# Patient Record
Sex: Female | Born: 2000 | Race: White | Hispanic: No | Marital: Single | Smoking: Never smoker
Health system: Southern US, Community
[De-identification: ages and names within clinical notes are randomized; demographics above are authoritative.]

## PROBLEM LIST (undated history)

## (undated) DIAGNOSIS — N946 Dysmenorrhea, unspecified: Secondary | ICD-10-CM

## (undated) DIAGNOSIS — E282 Polycystic ovarian syndrome: Secondary | ICD-10-CM

## (undated) DIAGNOSIS — E079 Disorder of thyroid, unspecified: Secondary | ICD-10-CM

## (undated) HISTORY — DX: Dysmenorrhea, unspecified: N94.6

## (undated) HISTORY — DX: Disorder of thyroid, unspecified: E07.9

## (undated) HISTORY — DX: Polycystic ovarian syndrome: E28.2

---
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
## 2019-02-24 DIAGNOSIS — L700 Acne vulgaris: Secondary | ICD-10-CM | POA: Diagnosis not present

## 2019-03-05 DIAGNOSIS — Z1159 Encounter for screening for other viral diseases: Secondary | ICD-10-CM | POA: Diagnosis not present

## 2019-03-05 DIAGNOSIS — J029 Acute pharyngitis, unspecified: Secondary | ICD-10-CM | POA: Diagnosis not present

## 2019-03-05 DIAGNOSIS — J069 Acute upper respiratory infection, unspecified: Secondary | ICD-10-CM | POA: Diagnosis not present

## 2019-04-26 DIAGNOSIS — Z01812 Encounter for preprocedural laboratory examination: Secondary | ICD-10-CM | POA: Diagnosis not present

## 2019-04-26 DIAGNOSIS — Z20828 Contact with and (suspected) exposure to other viral communicable diseases: Secondary | ICD-10-CM | POA: Diagnosis not present

## 2019-05-10 DIAGNOSIS — Z20828 Contact with and (suspected) exposure to other viral communicable diseases: Secondary | ICD-10-CM | POA: Diagnosis not present

## 2019-05-13 ENCOUNTER — Encounter: Payer: BC Managed Care – PPO | Admitting: Obstetrics and Gynecology

## 2019-05-28 DIAGNOSIS — M5416 Radiculopathy, lumbar region: Secondary | ICD-10-CM | POA: Diagnosis not present

## 2019-05-28 DIAGNOSIS — M9903 Segmental and somatic dysfunction of lumbar region: Secondary | ICD-10-CM | POA: Diagnosis not present

## 2019-05-28 DIAGNOSIS — M62830 Muscle spasm of back: Secondary | ICD-10-CM | POA: Diagnosis not present

## 2019-05-28 DIAGNOSIS — M9901 Segmental and somatic dysfunction of cervical region: Secondary | ICD-10-CM | POA: Diagnosis not present

## 2019-06-05 DIAGNOSIS — E063 Autoimmune thyroiditis: Secondary | ICD-10-CM | POA: Diagnosis not present

## 2019-06-05 DIAGNOSIS — D164 Benign neoplasm of bones of skull and face: Secondary | ICD-10-CM | POA: Diagnosis not present

## 2019-07-03 ENCOUNTER — Telehealth: Payer: Self-pay

## 2019-07-03 NOTE — Telephone Encounter (Signed)
Ms. Devenport called stating that she is having irregular bleeding and would like a call from the doctor or nurse.   Thank you, Ardelle Lesches

## 2019-07-08 NOTE — Telephone Encounter (Signed)
Pt stated that she has noticed that the bleeding has stopped for the most part. Pt made an appointment to be seen to discuss the issues with [facility].

## 2019-07-15 ENCOUNTER — Encounter: Payer: BC Managed Care – PPO | Admitting: Obstetrics and Gynecology

## 2019-07-29 ENCOUNTER — Encounter: Payer: BC Managed Care – PPO | Admitting: Obstetrics and Gynecology

## 2019-07-30 DIAGNOSIS — M9901 Segmental and somatic dysfunction of cervical region: Secondary | ICD-10-CM | POA: Diagnosis not present

## 2019-07-30 DIAGNOSIS — M9903 Segmental and somatic dysfunction of lumbar region: Secondary | ICD-10-CM | POA: Diagnosis not present

## 2019-07-30 DIAGNOSIS — M62830 Muscle spasm of back: Secondary | ICD-10-CM | POA: Diagnosis not present

## 2019-07-30 DIAGNOSIS — M5416 Radiculopathy, lumbar region: Secondary | ICD-10-CM | POA: Diagnosis not present

## 2019-08-15 DIAGNOSIS — K011 Impacted teeth: Secondary | ICD-10-CM | POA: Diagnosis not present

## 2019-08-15 HISTORY — PX: WISDOM TOOTH EXTRACTION: SHX21

## 2019-08-21 NOTE — Progress Notes (Signed)
GYNECOLOGY ANNUAL PHYSICAL EXAM PROGRESS NOTE  Subjective:    Dominique Morris is a 19 y.o. G0P0000 female with a history of dysmenorrhea and thyroid disease who presents for an annual exam. The patient is sexually active (1 partner).  The patient wears seatbelts: yes. Has the patient ever been transfused or tattooed?: no. The patient reports that there is not domestic violence in her life.   The patient has the following complaints today.  1. Patient has a history of psoriasis (mostly in scalp and behind ears)  which is managed by her Dermatologist.  Rozell Searing that she may be getting a patch in the vulvar area. Has itching sometimes.    Gynecologic History No LMP recorded. Menarche age: 60 or 70.  Contraception: OCP (estrogen/progesterone) History of STI's: Denies Last Pap: Patient has never had one.    OB History  Gravida Para Term Preterm AB Living  0 0 0 0 0 0  SAB TAB Ectopic Multiple Live Births  0 0 0 0 0    Past Medical History:  Diagnosis Date  . Dysmenorrhea   . Thyroid disease     Past Surgical History:  Procedure Laterality Date  . OSTEOCHONDROMA EXCISION  02/2014    Family History  Problem Relation Age of Onset  . Endometriosis Mother   . Hyperlipidemia Father   . Breast cancer Neg Hx   . Ovarian cancer Neg Hx   . Colon cancer Neg Hx     Social History   Socioeconomic History  . Marital status: Single    Spouse name: Not on file  . Number of children: Not on file  . Years of education: Not on file  . Highest education level: Not on file  Occupational History  . Not on file  Tobacco Use  . Smoking status: Never Smoker  . Smokeless tobacco: Never Used  Substance and Sexual Activity  . Alcohol use: No  . Drug use: No  . Sexual activity: Yes    Birth control/protection: Pill, Condom  Other Topics Concern  . Not on file  Social History Narrative  . Not on file   Social Determinants of Health   Financial Resource Strain:   . Difficulty  of Paying Living Expenses:   Food Insecurity:   . Worried About Charity fundraiser in the Last Year:   . Arboriculturist in the Last Year:   Transportation Needs:   . Film/video editor (Medical):   Marland Kitchen Lack of Transportation (Non-Medical):   Physical Activity:   . Days of Exercise per Week:   . Minutes of Exercise per Session:   Stress:   . Feeling of Stress :   Social Connections:   . Frequency of Communication with Friends and Family:   . Frequency of Social Gatherings with Friends and Family:   . Attends Religious Services:   . Active Member of Clubs or Organizations:   . Attends Archivist Meetings:   Marland Kitchen Marital Status:   Intimate Partner Violence:   . Fear of Current or Ex-Partner:   . Emotionally Abused:   Marland Kitchen Physically Abused:   . Sexually Abused:     Current Outpatient Medications on File Prior to Visit  Medication Sig Dispense Refill  . Doxycycline Hyclate 150 MG TABS Take 150 mg by mouth daily. 10 tablet 1  . ibuprofen (ADVIL,MOTRIN) 800 MG tablet Take 1 tablet (800 mg total) by mouth every 8 (eight) hours as needed for cramping. Justice  tablet 3  . norgestimate-ethinyl estradiol (ORTHO-CYCLEN) 0.25-35 MG-MCG tablet Take 1 tablet by mouth daily. 1 Package 11   No current facility-administered medications on file prior to visit.    Allergies  Allergen Reactions  . Latex     Other reaction(s): Other (See Comments) Other Reaction: Sesitivity      Review of Systems Constitutional: negative for chills, fatigue, fevers and sweats Eyes: negative for irritation, redness and visual disturbance Ears, nose, mouth, throat, and face: negative for hearing loss, nasal congestion, snoring and tinnitus Respiratory: negative for asthma, cough, sputum Cardiovascular: negative for chest pain, dyspnea, exertional chest pressure/discomfort, irregular heart beat, palpitations and syncope Gastrointestinal: negative for abdominal pain, change in bowel habits, nausea and  vomiting Genitourinary: negative for abnormal menstrual periods, genital lesions, sexual problems and vaginal discharge, dysuria and urinary incontinence. Positive for intermittent vulvar itching.  Integument/breast: negative for breast lump, breast tenderness and nipple discharge Hematologic/lymphatic: negative for bleeding and easy bruising Musculoskeletal:negative for back pain and muscle weakness Neurological: negative for dizziness, headaches, vertigo and weakness Endocrine: negative for diabetic symptoms including polydipsia, polyuria and skin dryness Allergic/Immunologic: negative for hay fever and urticaria        Objective:  Blood pressure 125/77, pulse 79, height 5\' 9"  (1.753 m), weight 199 lb (90.3 kg), last menstrual period 08/11/2019. Body mass index is 29.39 kg/m.  General Appearance:    Alert, cooperative, no distress, appears stated age, overweight.   Head:    Normocephalic, without obvious abnormality, atraumatic  Eyes:    PERRL, conjunctiva/corneas clear, EOM's intact, both eyes  Ears:    Normal external ear canals, both ears  Nose:   Nares normal, septum midline, mucosa normal, no drainage or sinus tenderness  Throat:   Lips, mucosa, and tongue normal; teeth and gums normal  Neck:   Supple, symmetrical, trachea midline, no adenopathy; thyroid: no enlargement/tenderness/nodules; no carotid bruit or JVD  Back:     Symmetric, no curvature, ROM normal, no CVA tenderness  Lungs:     Clear to auscultation bilaterally, respirations unlabored  Chest Wall:    No tenderness or deformity   Heart:    Regular rate and rhythm, S1 and S2 normal, no murmur, rub or gallop  Breast Exam:    No tenderness, masses, or nipple abnormality  Abdomen:     Soft, non-tender, bowel sounds active all four quadrants, no masses, no organomegaly.    Genitalia:    Pelvic:external genitalia with multiple areas of hypopigmentation/lichenification of the labia majora, vagina without lesions, discharge, or  tenderness, rectovaginal septum  normal. Cervix normal in appearance, no cervical motion tenderness, no adnexal masses or tenderness.  Uterus normal size, shape, mobile, regular contours, nontender.  Rectal:    Normal external sphincter.  No hemorrhoids appreciated. Internal exam not done.   Extremities:   Extremities normal, atraumatic, no cyanosis or edema  Pulses:   2+ and symmetric all extremities  Skin:   Skin color, texture, turgor normal, no rashes or lesions  Lymph nodes:   Cervical, supraclavicular, and axillary nodes normal  Neurologic:   CNII-XII intact, normal strength, sensation and reflexes throughout   .  Labs:  Lab Results  Component Value Date   WBC 6.2 05/24/2018   HGB 13.6 05/24/2018   HCT 40.2 05/24/2018   MCV 91 05/24/2018   PLT 186 05/24/2018    Lab Results  Component Value Date   CREATININE 0.90 05/24/2018   BUN 13 05/24/2018   NA 141 05/24/2018   K 4.2  05/24/2018   CL 106 05/24/2018   CO2 22 05/24/2018    No results found for: ALT, AST, GGT, ALKPHOS, BILITOT  Lab Results  Component Value Date   TSH 5.970 (H) 05/24/2018     Assessment:   1. Well woman exam with routine gynecological exam   2. Thyroid disease   3. Encounter for surveillance of contraceptive pills   4. Dysmenorrhea treated with oral contraceptive   5. Lichenified rash   6. Overweight (BMI 25.0-29.9)    Plan:     Blood tests: CBC with diff and Comprehensive metabolic panel.   Contraception: OCP (estrogen/progesterone). Refill given.  Discussed healthy lifestyle modifications. Patient's thyroid disease managed by Endocrinologist.  Mild lichenification, discussed topical treatments for vulvar itching (aloe vera, oatmeal, coconut oil, or hydrocortisone cream). If symptoms progress, may require prescription strength steroids.  Pap smear:  not performed. To begin cervical cancer screen at age 28.  Declines STI testing.  Declines flu vaccine.  Follow up in 1 year for annual exam,  or sooner as needed.   Hildred Laser, MD [facility]

## 2019-08-21 NOTE — Patient Instructions (Signed)
Preventive Care 18-21 Years Old, Female Preventive care refers to lifestyle choices and visits with your health care provider that can promote health and wellness. At this stage in your life, you may start seeing a primary care physician instead of a pediatrician. Your health care is now your responsibility. Preventive care for young adults includes:  A yearly physical exam. This is also called an annual wellness visit.  Regular dental and eye exams.  Immunizations.  Screening for certain conditions.  Healthy lifestyle choices, such as diet and exercise. What can I expect for my preventive care visit? Physical exam Your health care provider may check:  Height and weight. These may be used to calculate body mass index (BMI), which is a measurement that tells if you are at a healthy weight.  Heart rate and blood pressure.  Body temperature. Counseling Your health care provider may ask you questions about:  Past medical problems and family medical history.  Alcohol, tobacco, and drug use.  Home and relationship well-being.  Access to firearms.  Emotional well-being.  Diet, exercise, and sleep habits.  Sexual activity and sexual health.  Method of birth control.  Menstrual cycle.  Pregnancy history. What immunizations do I need?  Influenza (flu) vaccine  This is recommended every year. Tetanus, diphtheria, and pertussis (Tdap) vaccine  You may need a Td booster every 10 years. Varicella (chickenpox) vaccine  You may need this vaccine if you have not already been vaccinated. Human papillomavirus (HPV) vaccine  If recommended by your health care provider, you may need three doses over 6 months. Measles, mumps, and rubella (MMR) vaccine  You may need at least one dose of MMR. You may also need a second dose. Meningococcal conjugate (MenACWY) vaccine  One dose is recommended if you are 19-21 years old and a first-year college student living in a residence hall,  or if you have one of several medical conditions. You may also need additional booster doses. Pneumococcal conjugate (PCV13) vaccine  You may need this if you have certain conditions and were not previously vaccinated. Pneumococcal polysaccharide (PPSV23) vaccine  You may need one or two doses if you smoke cigarettes or if you have certain conditions. Hepatitis A vaccine  You may need this if you have certain conditions or if you travel or work in places where you may be exposed to hepatitis A. Hepatitis B vaccine  You may need this if you have certain conditions or if you travel or work in places where you may be exposed to hepatitis B. Haemophilus influenzae type b (Hib) vaccine  You may need this if you have certain risk factors. You may receive vaccines as individual doses or as more than one vaccine together in one shot (combination vaccines). Talk with your health care provider about the risks and benefits of combination vaccines. What tests do I need? Blood tests  Lipid and cholesterol levels. These may be checked every 5 years starting at age 20.  Hepatitis C test.  Hepatitis B test. Screening  Pelvic exam and Pap test. This may be done every 3 years starting at age 21.  Sexually transmitted disease (STD) testing, if you are at risk.  BRCA-related cancer screening. This may be done if you have a family history of breast, ovarian, tubal, or peritoneal cancers. Other tests  Tuberculosis skin test.  Vision and hearing tests.  Skin exam.  Breast exam. Follow these instructions at home: Eating and drinking   Eat a diet that includes fresh fruits and   vegetables, whole grains, lean protein, and low-fat dairy products.  Drink enough fluid to keep your urine pale yellow.  Do not drink alcohol if: ? Your health care provider tells you not to drink. ? You are pregnant, may be pregnant, or are planning to become pregnant. ? You are under the legal drinking age. In the  [place] the legal drinking age is 36.  If you drink alcohol: ? Limit how much you have to 0-1 drink a day. ? Be aware of how much alcohol is in your drink. In the [place] one drink equals one 12 oz bottle of beer (355 mL), one 5 oz glass of wine (148 mL), or one 1 oz glass of hard liquor (44 mL). Lifestyle  Take daily care of your teeth and gums.  Stay active. Exercise at least 30 minutes 5 or more days of the week.  Do not use any products that contain nicotine or tobacco, such as cigarettes, e-cigarettes, and chewing tobacco. If you need help quitting, ask your health care provider.  Do not use drugs.  If you are sexually active, practice safe sex. Use a condom or other form of birth control (contraception) in order to prevent pregnancy and STIs (sexually transmitted infections). If you plan to become pregnant, see your health care provider for a pre-conception visit.  Find healthy ways to cope with stress, such as: ? Meditation, yoga, or listening to music. ? Journaling. ? Talking to a trusted person. ? Spending time with friends and family. Safety  Always wear your seat belt while driving or riding in a vehicle.  Do not drive if you have been drinking alcohol. Do not ride with someone who has been drinking.  Do not drive when you are tired or distracted. Do not text while driving.  Wear a helmet and other protective equipment during sports activities.  If you have firearms in your house, make sure you follow all gun safety procedures.  Seek help if you have been bullied, physically abused, or sexually abused.  Use the Internet responsibly to avoid dangers such as online bullying and online sex predators. What's next?  Go to your health care provider once a year for a well check visit.  Ask your health care provider how often you should have your eyes and teeth checked.  Stay up to date on all vaccines. This information is not intended to replace advice given to you by  your health care provider. Make sure you discuss any questions you have with your health care provider. Document Revised: 05/09/2018 Document Reviewed: 05/09/2018 Elsevier Patient Education  2020 [employer].

## 2019-08-22 ENCOUNTER — Ambulatory Visit (INDEPENDENT_AMBULATORY_CARE_PROVIDER_SITE_OTHER): Payer: BC Managed Care – PPO | Admitting: Obstetrics and Gynecology

## 2019-08-22 ENCOUNTER — Other Ambulatory Visit: Payer: Self-pay

## 2019-08-22 ENCOUNTER — Encounter: Payer: Self-pay | Admitting: Obstetrics and Gynecology

## 2019-08-22 VITALS — BP 125/77 | HR 79 | Ht 69.0 in | Wt 199.0 lb

## 2019-08-22 DIAGNOSIS — Z3041 Encounter for surveillance of contraceptive pills: Secondary | ICD-10-CM | POA: Diagnosis not present

## 2019-08-22 DIAGNOSIS — N946 Dysmenorrhea, unspecified: Secondary | ICD-10-CM

## 2019-08-22 DIAGNOSIS — Z793 Long term (current) use of hormonal contraceptives: Secondary | ICD-10-CM

## 2019-08-22 DIAGNOSIS — E663 Overweight: Secondary | ICD-10-CM

## 2019-08-22 DIAGNOSIS — E079 Disorder of thyroid, unspecified: Secondary | ICD-10-CM | POA: Diagnosis not present

## 2019-08-22 DIAGNOSIS — Z01419 Encounter for gynecological examination (general) (routine) without abnormal findings: Secondary | ICD-10-CM | POA: Diagnosis not present

## 2019-08-22 DIAGNOSIS — L280 Lichen simplex chronicus: Secondary | ICD-10-CM

## 2019-08-22 MED ORDER — NORGESTIMATE-ETH ESTRADIOL 0.25-35 MG-MCG PO TABS: 1.0000 | ORAL_TABLET | Freq: Every day | ORAL | 3 refills | Status: DC

## 2019-08-23 LAB — COMPREHENSIVE METABOLIC PANEL
ALT: 14 IU/L (ref 0–32)
AST: 19 IU/L (ref 0–40)
Albumin/Globulin Ratio: 2 (ref 1.2–2.2)
Albumin: 4.5 g/dL (ref 3.9–5.0)
Alkaline Phosphatase: 62 IU/L (ref 39–117)
BUN/Creatinine Ratio: 15 (ref 9–23)
BUN: 16 mg/dL (ref 6–20)
Bilirubin Total: 0.3 mg/dL (ref 0.0–1.2)
CO2: 25 mmol/L (ref 20–29)
Calcium: 9.3 mg/dL (ref 8.7–10.2)
Chloride: 104 mmol/L (ref 96–106)
Creatinine, Ser: 1.07 mg/dL — ABNORMAL HIGH (ref 0.57–1.00)
GFR calc Af Amer: 87 mL/min/{1.73_m2} (ref >59)
GFR calc non Af Amer: 75 mL/min/{1.73_m2} (ref >59)
Globulin, Total: 2.3 g/dL (ref 1.5–4.5)
Glucose: 89 mg/dL (ref 65–99)
Potassium: 3.7 mmol/L (ref 3.5–5.2)
Sodium: 142 mmol/L (ref 134–144)
Total Protein: 6.8 g/dL (ref 6.0–8.5)

## 2019-08-23 LAB — CBC
Hematocrit: 38.1 % (ref 34.0–46.6)
Hemoglobin: 12.6 g/dL (ref 11.1–15.9)
MCH: 31.4 pg (ref 26.6–33.0)
MCHC: 33.1 g/dL (ref 31.5–35.7)
MCV: 95 fL (ref 79–97)
Platelets: 179 10*3/uL (ref 150–450)
RBC: 4.01 x10E6/uL (ref 3.77–5.28)
RDW: 12 % (ref 11.7–15.4)
WBC: 4.6 10*3/uL (ref 3.4–10.8)

## 2019-09-24 ENCOUNTER — Other Ambulatory Visit: Payer: Self-pay

## 2019-09-24 ENCOUNTER — Ambulatory Visit: Payer: BC Managed Care – PPO | Admitting: Dermatology

## 2019-09-24 DIAGNOSIS — L700 Acne vulgaris: Secondary | ICD-10-CM

## 2019-09-24 DIAGNOSIS — L409 Psoriasis, unspecified: Secondary | ICD-10-CM | POA: Diagnosis not present

## 2019-09-24 MED ORDER — EPIDUO FORTE 0.3-2.5 % EX GEL: 1.0000 "application " | Freq: Every day | CUTANEOUS | 6 refills | Status: DC

## 2019-09-24 MED ORDER — DOXYCYCLINE MONOHYDRATE 150 MG PO TABS: 150.0000 mg | ORAL_TABLET | Freq: Every day | ORAL | 6 refills | Status: DC

## 2019-09-24 MED ORDER — MOMETASONE FUROATE 0.1 % EX SOLN: Freq: Every day | CUTANEOUS | 6 refills | Status: DC

## 2019-09-24 NOTE — Progress Notes (Signed)
   Follow-Up Visit   Subjective  Dominique Morris is a 19 y.o. female who presents for the following: Acne (follow up - well controlled. Doxycycline 150mg  1 po qd. She is not using Epiduo Forte right now. Dr wanted to start Spironolactone at last appointment but she decided that she did not want to take it.) and Psoriasis (follow up of scalp. Treating with Mometasone - needs a refill.).    The following portions of the chart were reviewed this encounter and updated as appropriate:  Tobacco  Allergies  Meds  Problems  Med Hx  Surg Hx  Fam Hx      Review of Systems:  No other skin or systemic complaints except as noted in HPI or Assessment and Plan.  Objective  Well appearing patient in no apparent distress; mood and affect are within normal limits.  A focused examination was performed including face, scalp. Relevant physical exam findings are noted in the Assessment and Plan.  Objective  Head - Anterior (Face): Small papules mostly of lower face/mandible (moderate). Face is otherwise clear.  Objective  Scalp: Clear today.   Assessment & Plan  Acne vulgaris Head - Anterior (Face)  Discussed Isotretinoin. Patient declines at this time.  Discussed Spironolactone. Patient declines at this time.   doxycycline (ADOXA) 150 MG tablet - Head - Anterior (Face)  Adapalene-Benzoyl Peroxide (EPIDUO FORTE) 0.3-2.5 % GEL - Head - Anterior (Face)  Psoriasis Scalp  Reordered Medications mometasone (ELOCON) 0.1 % lotion  Return in about 6 months (around 03/25/2020).  I, 03/27/2020, CMA, am acting as scribe for Joanie Coddington, MD .    Documentation: I have reviewed the above documentation for accuracy and completeness, and I agree with the above.  Armida Sans, MD

## 2019-09-26 ENCOUNTER — Encounter: Payer: Self-pay | Admitting: Dermatology

## 2019-10-30 ENCOUNTER — Encounter: Payer: BC Managed Care – PPO | Admitting: Obstetrics and Gynecology

## 2019-11-06 NOTE — Progress Notes (Signed)
Pt present today for irregular bleeding. Pt stated starting her cycle 3-4 days earlier and having heavy bleeding. Pt stated this was the first month it happen since being on this birth control.

## 2019-11-07 ENCOUNTER — Encounter: Payer: Self-pay | Admitting: Obstetrics and Gynecology

## 2019-11-07 ENCOUNTER — Other Ambulatory Visit: Payer: Self-pay

## 2019-11-07 ENCOUNTER — Ambulatory Visit: Payer: BC Managed Care – PPO | Admitting: Obstetrics and Gynecology

## 2019-11-07 VITALS — BP 119/82 | HR 81 | Ht 69.0 in | Wt 215.0 lb

## 2019-11-07 DIAGNOSIS — N921 Excessive and frequent menstruation with irregular cycle: Secondary | ICD-10-CM | POA: Diagnosis not present

## 2019-11-07 DIAGNOSIS — E063 Autoimmune thyroiditis: Secondary | ICD-10-CM

## 2019-11-07 DIAGNOSIS — F439 Reaction to severe stress, unspecified: Secondary | ICD-10-CM | POA: Diagnosis not present

## 2019-11-07 MED ORDER — IBUPROFEN 800 MG PO TABS: 800.0000 mg | ORAL_TABLET | Freq: Three times a day (TID) | ORAL | 3 refills | Status: DC | PRN

## 2019-11-07 NOTE — Progress Notes (Signed)
    GYNECOLOGY PROGRESS NOTE  Subjective:    Patient ID: Dominique Morris, female    DOB: [birth year], 19 y.o.   MRN: [id number]  HPI  Patient is a 19 y.o. G0P0000 female with a h/o hypothyroidism who presents for complaints of bleeding ~ 20 week prior to the end of her cycle.  Notes bleeding was light in flow, but dark brown blood. Notes that this is the first time this has happened.  Started taking a sample of Taytulla on top of her Sprintec which helped the bleeding some, but did not completely stop it (decreased to spotting).  Continued to have her normal cycle the following week.  She has had breakthrough bleeding in the past with other OCPs and wonders if this will be the same on the Sprintec.   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   The following portions of the patient's history were reviewed and updated as appropriate: allergies, current medications, past family history, past medical history, past social history, past surgical history and problem list.    Review of Systems Pertinent items noted in HPI and remainder of comprehensive ROS otherwise negative.   Objective:   Blood pressure 119/82, pulse 81, height 5\' 9"  (1.753 m), weight 215 lb (97.5 kg), last menstrual period 11/02/2019. General appearance: alert and no distress  CVS: normal rate, regular rhythm, normal S1, S2, no murmurs, rubs, clicks or gallops. Lungs: Normal respiratory effort. Lungs are clear to auscultation, no crackles or wheezes. Abdomen: soft, non-tender; bowel sounds normal; no masses,  no organomegaly Pelvic: deferred  Assessment:   Breakthrough bleeding on OCPs Hypothyroidism Stress  Plan:   - Discussed option of waiting another month to see if breakthrough bleeding persists, or if this was currently due to additional physical and emotional stress of beginning nursing school on top of currently working.  If  bleeding continues, patient desires to try a different method of contraception.  Discussed available options (preferrably one that does not require digestion in light of her hypothyroidism and medication which could also be affecting her cycles).  Patient has decided on IUD if bleeding persists. Given handout.   - To notify MD in 1 month if IUD desired, or if breakthrough bleeding on OCPs has subsided.  - Encouraged stress management to also assist with prevention of breakthrough bleeding.    01/02/2020, MD [facility]

## 2019-11-12 ENCOUNTER — Other Ambulatory Visit: Payer: Self-pay

## 2019-11-12 DIAGNOSIS — L700 Acne vulgaris: Secondary | ICD-10-CM

## 2019-11-12 MED ORDER — EPIDUO FORTE 0.3-2.5 % EX GEL: 1.0000 "application " | Freq: Every evening | CUTANEOUS | 2 refills | Status: DC

## 2019-11-12 NOTE — Progress Notes (Signed)
Patient called asking for Epiduo Forte to be sent into [facility] instead of mail in pharmacy.

## 2019-11-24 ENCOUNTER — Telehealth: Payer: Self-pay | Admitting: Obstetrics and Gynecology

## 2019-11-24 NOTE — Telephone Encounter (Signed)
Patient called in asking if she could have another sample of the taytulla. Could you please advise.

## 2019-11-27 NOTE — Telephone Encounter (Signed)
Pt called and she is aware that she can come pick up a sample of taytulla. Pt stated that she would be by on Friday to pick it up.

## 2019-11-28 ENCOUNTER — Telehealth: Payer: Self-pay

## 2019-11-28 ENCOUNTER — Ambulatory Visit: Payer: BLUE CROSS/BLUE SHIELD | Admitting: Nurse Practitioner

## 2019-11-28 NOTE — Telephone Encounter (Signed)
Pt called no answer LM via VM that we were out of samples of Taytulla. Pt was informed that I could send in a rx for Taytulla to her pharmacy or I would speak to [facility] when she arrives to see what other birth control she could be given that is simpler to [place] that we had samples of in the office. Pt was advised to call the office with discussion.

## 2019-12-29 ENCOUNTER — Ambulatory Visit: Payer: BC Managed Care – PPO | Admitting: Hospice and Palliative Medicine

## 2019-12-29 ENCOUNTER — Telehealth: Payer: Self-pay

## 2019-12-29 ENCOUNTER — Other Ambulatory Visit: Payer: Self-pay

## 2019-12-29 ENCOUNTER — Encounter: Payer: Self-pay | Admitting: Nurse Practitioner

## 2019-12-29 DIAGNOSIS — B373 Candidiasis of vulva and vagina: Secondary | ICD-10-CM | POA: Diagnosis not present

## 2019-12-29 DIAGNOSIS — R350 Frequency of micturition: Secondary | ICD-10-CM | POA: Diagnosis not present

## 2019-12-29 DIAGNOSIS — R300 Dysuria: Secondary | ICD-10-CM

## 2019-12-29 DIAGNOSIS — E079 Disorder of thyroid, unspecified: Secondary | ICD-10-CM | POA: Diagnosis not present

## 2019-12-29 DIAGNOSIS — R3589 Other polyuria: Secondary | ICD-10-CM

## 2019-12-29 DIAGNOSIS — R358 Other polyuria: Secondary | ICD-10-CM

## 2019-12-29 DIAGNOSIS — Z719 Counseling, unspecified: Secondary | ICD-10-CM

## 2019-12-29 DIAGNOSIS — B3731 Acute candidiasis of vulva and vagina: Secondary | ICD-10-CM

## 2019-12-29 LAB — POCT URINALYSIS DIPSTICK
Bilirubin, UA: NEGATIVE
Glucose, UA: NEGATIVE
Ketones, UA: NEGATIVE
Leukocytes, UA: NEGATIVE
Nitrite, UA: NEGATIVE
Protein, UA: NEGATIVE
Spec Grav, UA: 1.01 (ref 1.010–1.025)
Urobilinogen, UA: 0.2 E.U./dL
pH, UA: 5 (ref 5.0–8.0)

## 2019-12-29 MED ORDER — NITROFURANTOIN MONOHYD MACRO 100 MG PO CAPS: ORAL_CAPSULE | ORAL | 0 refills | Status: DC

## 2019-12-29 MED ORDER — FLUCONAZOLE 150 MG PO TABS: 150.0000 mg | ORAL_TABLET | Freq: Once | ORAL | 0 refills | Status: AC

## 2019-12-29 NOTE — Telephone Encounter (Signed)
Confirmed and screened for 12-29-19 ov. °

## 2019-12-29 NOTE — Progress Notes (Signed)
[facility] [address] [place], [place] [address]  Internal MEDICINE  Office Visit Note  Patient Name: Dominique Morris  [id number]  [id number]  Date of Service: 12/30/2019  Chief Complaint  Patient presents with  . Acute Visit    possible uti    HPI Pt is here for a sick visit. Came in for possible UTI. Symptoms include polyuria and flank pain and headaches. Denies dysuria. Has had UTI in the past and symptoms feel consistent. Symptoms have been ongoing for 2-3 days. Her biggest complaint is the increased frequency and feeling as though she won't be able to make it to the bathroom. Denies fever or chills. Mentions she frequently gets yeast infections due to being on chronic antibiotics for her acne, will prescribe Diflucan if she feels she needs it while on additional antibiotics. Discussed with her the importance of good feminine hygiene as well as sexual hygiene.   Current Medication:  Outpatient Encounter Medications as of 12/29/2019  Medication Sig  . Adapalene-Benzoyl Peroxide (EPIDUO FORTE) 0.3-2.5 % GEL Apply 1 application topically at bedtime.  Marland Kitchen doxycycline (ADOXA) 150 MG tablet Take 1 tablet (150 mg total) by mouth daily.  . Doxycycline Hyclate 150 MG TABS Take 150 mg by mouth daily.  Marland Kitchen ibuprofen (ADVIL) 800 MG tablet Take 1 tablet (800 mg total) by mouth every 8 (eight) hours as needed for cramping.  Marland Kitchen levothyroxine (SYNTHROID) 75 MCG tablet Take 75 mcg by mouth 1 day or 1 dose.  . mometasone (ELOCON) 0.1 % lotion Apply topically daily.  . norgestimate-ethinyl estradiol (ORTHO-CYCLEN) 0.25-35 MG-MCG tablet Take 1 tablet by mouth daily.  . [DISCONTINUED] levothyroxine (SYNTHROID) 125 MCG tablet Take one half tablet by mouth daily on an empty stomach with a glass of water at least 30-60 minutes before breakfast.  . [EXPIRED] fluconazole (DIFLUCAN) 150 MG tablet Take 1 tablet (150 mg total) by mouth once for 1 dose.  . nitrofurantoin, macrocrystal-monohydrate,  (MACROBID) 100 MG capsule Take one tab po bid x 5 days   No facility-administered encounter medications on file as of 12/29/2019.      Medical History: Past Medical History:  Diagnosis Date  . Dysmenorrhea   . Thyroid disease      Vital Signs: BP 130/78   Pulse 73   Temp 97.8 F (36.6 C)   Resp 16   Ht 5\' 9"  (1.753 m)   Wt 227 lb 12.8 oz (103.3 kg)   SpO2 99%   BMI 33.64 kg/m   Review of Systems  Constitutional: Negative for activity change, appetite change, chills, diaphoresis, fatigue, fever and unexpected weight change.  HENT: Negative for congestion, ear discharge, ear pain, facial swelling, hearing loss, nosebleeds, rhinorrhea, sinus pressure, sinus pain, sneezing, sore throat, tinnitus, trouble swallowing and voice change.   Eyes: Negative for photophobia, pain, discharge, redness, itching and visual disturbance.  Respiratory: Negative for apnea, cough, choking, chest tightness, shortness of breath, wheezing and stridor.   Cardiovascular: Negative for chest pain, palpitations and leg swelling.  Gastrointestinal: Negative for abdominal distention, abdominal pain, anal bleeding, constipation, diarrhea, nausea, rectal pain and vomiting.  Endocrine: Negative for cold intolerance, heat intolerance, polydipsia, polyphagia and polyuria.  Genitourinary: Positive for flank pain, frequency and urgency. Negative for difficulty urinating, dysuria, genital sores, hematuria, menstrual problem, pelvic pain, vaginal bleeding, vaginal discharge and vaginal pain.  Musculoskeletal: Negative for arthralgias, back pain, gait problem, joint swelling, myalgias and neck pain.  Skin: Negative for color change, pallor, rash and wound.  Allergic/Immunologic:  Negative for environmental allergies, food allergies and immunocompromised state.  Neurological: Negative.  Negative for dizziness, tremors, seizures, syncope, facial asymmetry, speech difficulty, weakness, light-headedness, numbness and  headaches.  Hematological: Negative for adenopathy. Does not bruise/bleed easily.  Psychiatric/Behavioral: Negative for agitation, behavioral problems (Depression), confusion, decreased concentration, dysphoric mood, hallucinations, self-injury, sleep disturbance and suicidal ideas. The patient is not nervous/anxious and is not hyperactive.     Physical Exam Constitutional:      General: She is not in acute distress.    Appearance: She is well-developed. She is not diaphoretic.  HENT:     Head: Normocephalic and atraumatic.     Mouth/Throat:     Pharynx: No oropharyngeal exudate.  Eyes:     Pupils: Pupils are equal, round, and reactive to light.  Neck:     Thyroid: No thyromegaly.     Vascular: No JVD.     Trachea: No tracheal deviation.  Cardiovascular:     Rate and Rhythm: Normal rate and regular rhythm.     Heart sounds: Normal heart sounds. No murmur heard.  No friction rub. No gallop.   Pulmonary:     Effort: Pulmonary effort is normal. No respiratory distress.     Breath sounds: No wheezing or rales.  Chest:     Chest wall: No tenderness.  Abdominal:     General: Bowel sounds are normal.     Palpations: Abdomen is soft.  Musculoskeletal:        General: Normal range of motion.     Cervical back: Normal range of motion and neck supple.  Lymphadenopathy:     Cervical: No cervical adenopathy.  Skin:    General: Skin is warm and dry.  Neurological:     Mental Status: She is alert and oriented to person, place, and time.     Cranial Nerves: No cranial nerve deficit.  Psychiatric:        Behavior: Behavior normal.        Thought Content: Thought content normal.        Judgment: Judgment normal.     Assessment/Plan: 1. Frequency of urination and polyuria Symptoms of uncomplicated cystitis. Will complete course of antibiotics. Await culture results, change course if needed. - POCT Urinalysis Dipstick - nitrofurantoin, macrocrystal-monohydrate, (MACROBID) 100 MG  capsule; Take one tab po bid x 5 days  Dispense: 10 capsule; Refill: 0 - CULTURE, URINE COMPREHENSIVE  2. Vaginal candidiasis History of vaginal candidiasis with antibiotic use. Diflucan to be completed if symptoms of candidiasis arise with antibiotic course. - fluconazole (DIFLUCAN) 150 MG tablet; Take 1 tablet (150 mg total) by mouth once for 1 dose.  Dispense: 1 tablet; Refill: 0  3. Thyroid disease Stable at this time, continue current therapy. - levothyroxine (SYNTHROID) 75 MCG tablet; Take 75 mcg by mouth 1 day or 1 dose.   General Counseling: Cymone verbalizes understanding of the findings of todays visit and agrees with plan of treatment. I have discussed any further diagnostic evaluation that may be needed or ordered today. We also reviewed her medications today. she has been encouraged to call the office with any questions or concerns that should arise related to todays visit.   Orders Placed This Encounter  Procedures  . CULTURE, URINE COMPREHENSIVE  . POCT Urinalysis Dipstick    Meds ordered this encounter  Medications  . nitrofurantoin, macrocrystal-monohydrate, (MACROBID) 100 MG capsule    Sig: Take one tab po bid x 5 days    Dispense:  10 capsule  Refill:  0  . fluconazole (DIFLUCAN) 150 MG tablet    Sig: Take 1 tablet (150 mg total) by mouth once for 1 dose.    Dispense:  1 tablet    Refill:  0    Time spent: 20 Minutes  This patient was seen by Leeanne Deed, AGNP-C in collaboration with Dr. Lyndon Code as part of a collaborative care agreement.  Lubertha Basque [facility]

## 2020-01-02 ENCOUNTER — Encounter: Payer: Self-pay | Admitting: Nurse Practitioner

## 2020-01-02 LAB — CULTURE, URINE COMPREHENSIVE

## 2020-01-03 NOTE — Progress Notes (Signed)
Please notify pt that she has a bacterial infection UTI, she needs to finish her antibiotics

## 2020-03-18 ENCOUNTER — Ambulatory Visit: Payer: BC Managed Care – PPO | Admitting: Dermatology

## 2020-04-03 ENCOUNTER — Encounter: Payer: Self-pay | Admitting: Hospice and Palliative Medicine

## 2020-04-09 NOTE — Telephone Encounter (Signed)
FYI

## 2020-04-26 ENCOUNTER — Ambulatory Visit: Payer: BC Managed Care – PPO | Admitting: Hospice and Palliative Medicine

## 2020-05-03 ENCOUNTER — Ambulatory Visit: Payer: BC Managed Care – PPO | Admitting: Hospice and Palliative Medicine

## 2020-05-03 ENCOUNTER — Other Ambulatory Visit: Payer: Self-pay

## 2020-05-03 ENCOUNTER — Encounter: Payer: Self-pay | Admitting: Hospice and Palliative Medicine

## 2020-05-03 DIAGNOSIS — R635 Abnormal weight gain: Secondary | ICD-10-CM

## 2020-05-03 DIAGNOSIS — E063 Autoimmune thyroiditis: Secondary | ICD-10-CM | POA: Diagnosis not present

## 2020-05-03 DIAGNOSIS — R5383 Other fatigue: Secondary | ICD-10-CM | POA: Diagnosis not present

## 2020-05-03 NOTE — Progress Notes (Signed)
[facility] [address] [place], [place] [address]  Internal MEDICINE  Office Visit Note  Patient Name: Dominique Morris  [id number]  [id number]  Date of Service: 05/06/2020  Chief Complaint  Patient presents with  . Follow-up    weight gain, couging x6 days but feels better today, diet hasnt changed, going to gym less but still moving at work  . Fatigue    x59months  . policy update form    received    HPI Patient is here for routine follow-up Mom is present in room today Her main concern today is weight gain--she reports a significant weight gain in a short amount of time, she has developed stretch marks and always feels fatigued She denies any changes in her diet, continues to go to the gym and remains active at her job She has been back to see her endocrinologist at [facility] for uncontrolled hypothyroidism--hx of Hashimoto disease, currently taking 75 and 88 mcg of levothyroxine on alternating days According to her mother--last thyroid levels had normalized Mother is concerned about Cushing's disease as she has been diagnosed with this and had similar symptoms her daughter is having prior to diagnosis  Current Medication: Outpatient Encounter Medications as of 05/03/2020  Medication Sig  . Adapalene-Benzoyl Peroxide (EPIDUO FORTE) 0.3-2.5 % GEL Apply 1 application topically at bedtime.  Marland Kitchen doxycycline (ADOXA) 150 MG tablet Take 1 tablet (150 mg total) by mouth daily.  . Doxycycline Hyclate 150 MG TABS Take 150 mg by mouth daily.  Marland Kitchen ibuprofen (ADVIL) 800 MG tablet Take 1 tablet (800 mg total) by mouth every 8 (eight) hours as needed for cramping.  Marland Kitchen levothyroxine (SYNTHROID) 75 MCG tablet Take 75 mcg by mouth 1 day or 1 dose.  . mometasone (ELOCON) 0.1 % lotion Apply topically daily.  . nitrofurantoin, macrocrystal-monohydrate, (MACROBID) 100 MG capsule Take one tab po bid x 5 days  . norgestimate-ethinyl estradiol (ORTHO-CYCLEN) 0.25-35 MG-MCG tablet Take 1 tablet by  mouth daily.   No facility-administered encounter medications on file as of 05/03/2020.    Surgical History: Past Surgical History:  Procedure Laterality Date  . OSTEOCHONDROMA EXCISION  02/2014  . WISDOM TOOTH EXTRACTION  08/15/2019    Medical History: Past Medical History:  Diagnosis Date  . Dysmenorrhea   . Thyroid disease     Family History: Family History  Problem Relation Age of Onset  . Endometriosis Mother   . Hyperlipidemia Father   . Endometriosis Maternal Aunt   . Breast cancer Neg Hx   . Ovarian cancer Neg Hx   . Colon cancer Neg Hx     Social History   Socioeconomic History  . Marital status: Single    Spouse name: Not on file  . Number of children: Not on file  . Years of education: Not on file  . Highest education level: Not on file  Occupational History  . Not on file  Tobacco Use  . Smoking status: Never Smoker  . Smokeless tobacco: Never Used  Vaping Use  . Vaping Use: Never used  Substance and Sexual Activity  . Alcohol use: No  . Drug use: No  . Sexual activity: Yes    Birth control/protection: Pill, Condom  Other Topics Concern  . Not on file  Social History Narrative  . Not on file   Social Determinants of Health   Financial Resource Strain: Not on file  Food Insecurity: Not on file  Transportation Needs: Not on file  Physical Activity: Not on  file  Stress: Not on file  Social Connections: Not on file  Intimate Partner Violence: Not on file     Review of Systems  Constitutional: Positive for fatigue and unexpected weight change. Negative for chills and diaphoresis.       Weight gain  HENT: Positive for congestion. Negative for ear pain, postnasal drip and sinus pressure.   Eyes: Negative for photophobia, discharge, redness, itching and visual disturbance.  Respiratory: Positive for cough. Negative for shortness of breath and wheezing.   Cardiovascular: Negative for chest pain, palpitations and leg swelling.   Gastrointestinal: Negative for abdominal pain, constipation, diarrhea, nausea and vomiting.  Genitourinary: Negative for dysuria and flank pain.  Musculoskeletal: Negative for arthralgias, back pain, gait problem and neck pain.  Skin: Negative for color change.  Allergic/Immunologic: Negative for environmental allergies and food allergies.  Neurological: Negative for dizziness and headaches.  Hematological: Does not bruise/bleed easily.  Psychiatric/Behavioral: Negative for agitation, behavioral problems (depression) and hallucinations.    Vital Signs: BP 130/86   Pulse 95   Temp (!) 97.5 F (36.4 C)   Resp 16   Ht 5\' 9"  (1.753 m)   Wt 251 lb 6.4 oz (114 kg)   SpO2 99%   BMI 37.13 kg/m    Physical Exam Vitals reviewed.  Constitutional:      Appearance: Normal appearance. She is obese.  HENT:     Mouth/Throat:     Mouth: Mucous membranes are moist.     Pharynx: Oropharynx is clear.  Cardiovascular:     Rate and Rhythm: Normal rate and regular rhythm.     Pulses: Normal pulses.     Heart sounds: Normal heart sounds.  Pulmonary:     Effort: Pulmonary effort is normal.     Breath sounds: Normal breath sounds.  Musculoskeletal:        General: Normal range of motion.     Cervical back: Normal range of motion.  Skin:    General: Skin is warm.  Neurological:     General: No focal deficit present.     Mental Status: She is alert and oriented to person, place, and time. Mental status is at baseline.  Psychiatric:        Mood and Affect: Mood normal.        Behavior: Behavior normal.        Thought Content: Thought content normal.        Judgment: Judgment normal.     Assessment/Plan: 1. Excessive weight gain Will review labs for underlying cause of weight gain Noted 30 pound weight gain since August 2021 - CBC w/Diff/Platelet - Comprehensive Metabolic Panel (CMET) - Lipid Panel With LDL/HDL Ratio - TSH+T4F+T3Free - B12 - Vitamin D (25 hydroxy) - Cortisol, Free  and Cortisone, 24 hour urine with Creatinine - Cortisol, urine, free  2. Fatigue, unspecified type Will review labs for underlying cause of fatigue - CBC w/Diff/Platelet - Comprehensive Metabolic Panel (CMET) - Lipid Panel With LDL/HDL Ratio - TSH+T4F+T3Free - B12 - Vitamin D (25 hydroxy) - Cortisol, Free and Cortisone, 24 hour urine with Creatinine - Cortisol, urine, free  3. Acquired autoimmune hypothyroidism Recheck thyroid panel--may require further adjustment in therapy, goal free T4 for age 74.5 - TSH+T4F+T3Free  General Counseling: Trea verbalizes understanding of the findings of todays visit and agrees with plan of treatment. I have discussed any further diagnostic evaluation that may be needed or ordered today. We also reviewed her medications today. she has been encouraged to call the  office with any questions or concerns that should arise related to todays visit.    Orders Placed This Encounter  Procedures  . CBC w/Diff/Platelet  . Comprehensive Metabolic Panel (CMET)  . Lipid Panel With LDL/HDL Ratio  . TSH+T4F+T3Free  . B12  . Vitamin D (25 hydroxy)  . Cortisol, Free and Cortisone, 24 hour urine with Creatinine  . Cortisol, urine, free   Time spent: 30 Minutes Time spent includes review of chart, medications, test results and follow-up plan with the patient.  This patient was seen by Leeanne Deed AGNP-C in Collaboration with Dr Lyndon Code as a part of collaborative care agreement     Lubertha Basque. Cielle Aguila AGNP-C Internal medicine

## 2020-05-04 ENCOUNTER — Encounter: Payer: Self-pay | Admitting: Hospice and Palliative Medicine

## 2020-05-04 LAB — LIPID PANEL WITH LDL/HDL RATIO

## 2020-05-05 LAB — CBC WITH DIFFERENTIAL/PLATELET
Basophils Absolute: 0 10*3/uL (ref 0.0–0.2)
Basos: 1 %
EOS (ABSOLUTE): 0.1 10*3/uL (ref 0.0–0.4)
Eos: 2 %
Hematocrit: 36.9 % (ref 34.0–46.6)
Hemoglobin: 12.9 g/dL (ref 11.1–15.9)
Immature Grans (Abs): 0 10*3/uL (ref 0.0–0.1)
Immature Granulocytes: 1 %
Lymphocytes Absolute: 2.2 10*3/uL (ref 0.7–3.1)
Lymphs: 49 %
MCH: 31.4 pg (ref 26.6–33.0)
MCHC: 35 g/dL (ref 31.5–35.7)
MCV: 90 fL (ref 79–97)
Monocytes Absolute: 0.4 10*3/uL (ref 0.1–0.9)
Monocytes: 8 %
Neutrophils Absolute: 1.7 10*3/uL (ref 1.4–7.0)
Neutrophils: 39 %
Platelets: 175 10*3/uL (ref 150–450)
RBC: 4.11 x10E6/uL (ref 3.77–5.28)
RDW: 12.2 % (ref 11.7–15.4)
WBC: 4.4 10*3/uL (ref 3.4–10.8)

## 2020-05-05 LAB — VITAMIN D 25 HYDROXY (VIT D DEFICIENCY, FRACTURES): Vit D, 25-Hydroxy: 21.3 ng/mL — ABNORMAL LOW (ref 30.0–100.0)

## 2020-05-05 LAB — TSH+T4F+T3FREE
Free T4: 1.06 ng/dL (ref 0.82–1.77)
T3, Free: 3.4 pg/mL (ref 2.0–4.4)
TSH: 11.2 u[IU]/mL — ABNORMAL HIGH (ref 0.450–4.500)

## 2020-05-05 LAB — COMPREHENSIVE METABOLIC PANEL
ALT: 77 IU/L — ABNORMAL HIGH (ref 0–32)
AST: 36 IU/L (ref 0–40)
Albumin/Globulin Ratio: 2 (ref 1.2–2.2)
Albumin: 4.3 g/dL (ref 3.9–5.0)
Alkaline Phosphatase: 72 IU/L (ref 42–106)
BUN/Creatinine Ratio: 13 (ref 9–23)
BUN: 13 mg/dL (ref 6–20)
Bilirubin Total: 0.2 mg/dL (ref 0.0–1.2)
CO2: 23 mmol/L (ref 20–29)
Calcium: 8.8 mg/dL (ref 8.7–10.2)
Chloride: 104 mmol/L (ref 96–106)
Creatinine, Ser: 0.97 mg/dL (ref 0.57–1.00)
GFR calc Af Amer: 97 mL/min/{1.73_m2} (ref >59)
GFR calc non Af Amer: 84 mL/min/{1.73_m2} (ref >59)
Globulin, Total: 2.2 g/dL (ref 1.5–4.5)
Glucose: 95 mg/dL (ref 65–99)
Potassium: 4.1 mmol/L (ref 3.5–5.2)
Sodium: 137 mmol/L (ref 134–144)
Total Protein: 6.5 g/dL (ref 6.0–8.5)

## 2020-05-05 LAB — LIPID PANEL WITH LDL/HDL RATIO
Cholesterol, Total: 186 mg/dL (ref 100–199)
HDL: 69 mg/dL (ref >39)
LDL Chol Calc (NIH): 101 mg/dL — ABNORMAL HIGH (ref 0–99)
LDL/HDL Ratio: 1.5 ratio (ref 0.0–3.2)
Triglycerides: 89 mg/dL (ref 0–149)
VLDL Cholesterol Cal: 16 mg/dL (ref 5–40)

## 2020-05-05 LAB — VITAMIN B12: Vitamin B-12: 326 pg/mL (ref 232–1245)

## 2020-05-06 ENCOUNTER — Encounter: Payer: Self-pay | Admitting: Hospice and Palliative Medicine

## 2020-05-10 ENCOUNTER — Ambulatory Visit: Payer: Self-pay | Admitting: Hospice and Palliative Medicine

## 2020-05-11 ENCOUNTER — Other Ambulatory Visit: Payer: Self-pay | Admitting: Hospice and Palliative Medicine

## 2020-05-11 DIAGNOSIS — E038 Other specified hypothyroidism: Secondary | ICD-10-CM

## 2020-05-11 MED ORDER — LEVOTHYROXINE SODIUM 100 MCG PO TABS: 100.0000 ug | ORAL_TABLET | Freq: Every day | ORAL | 3 refills | Status: DC

## 2020-05-11 NOTE — Progress Notes (Signed)
Stop current dose of levothyroxine, start 100 mcg daily. Have thyroid levels redrawn in 6 weeks, orders placed.

## 2020-05-16 LAB — CORTISOL, URINE, FREE
Cortisol (Ur), Free: 45 ug/24 hr — ABNORMAL HIGH (ref 6–42)
Cortisol,F,ug/L,U: 18 ug/L

## 2020-05-19 ENCOUNTER — Encounter: Payer: Self-pay | Admitting: Hospice and Palliative Medicine

## 2020-05-25 ENCOUNTER — Ambulatory Visit: Payer: Self-pay | Admitting: Hospice and Palliative Medicine

## 2020-05-25 NOTE — Progress Notes (Signed)
Please review

## 2020-05-26 ENCOUNTER — Other Ambulatory Visit: Payer: Self-pay | Admitting: Hospice and Palliative Medicine

## 2020-05-26 DIAGNOSIS — E063 Autoimmune thyroiditis: Secondary | ICD-10-CM

## 2020-05-26 NOTE — Progress Notes (Signed)
Add on prolactin, FSH/LH, fasting insulin and free T3. TSH needs to e normal before investigating for other etiology. Might get benefit by adding low dose Armor thyroid along with synthroid, will make changes after above results are available

## 2020-05-27 LAB — GLUCOSE, RANDOM: Glucose: 86 mg/dL (ref 65–99)

## 2020-05-27 LAB — T3, FREE: T3, Free: 3.7 pg/mL (ref 2.0–4.4)

## 2020-05-27 LAB — PROLACTIN: Prolactin: 13.1 ng/mL (ref 4.8–23.3)

## 2020-05-27 LAB — FSH/LH
FSH: 3 m[IU]/mL
LH: 8.7 m[IU]/mL

## 2020-06-09 ENCOUNTER — Encounter: Payer: Self-pay | Admitting: Hospice and Palliative Medicine

## 2020-06-09 ENCOUNTER — Other Ambulatory Visit: Payer: Self-pay

## 2020-06-09 ENCOUNTER — Ambulatory Visit: Payer: BC Managed Care – PPO | Admitting: Hospice and Palliative Medicine

## 2020-06-09 VITALS — BP 139/80 | HR 80 | Temp 98.0°F | Resp 16 | Ht 69.0 in | Wt 261.0 lb

## 2020-06-09 DIAGNOSIS — R82998 Other abnormal findings in urine: Secondary | ICD-10-CM

## 2020-06-09 DIAGNOSIS — E063 Autoimmune thyroiditis: Secondary | ICD-10-CM | POA: Diagnosis not present

## 2020-06-09 DIAGNOSIS — E038 Other specified hypothyroidism: Secondary | ICD-10-CM

## 2020-06-09 NOTE — Progress Notes (Signed)
[facility] [address] [place], [place] [address]  Internal MEDICINE  Office Visit Note  Patient Name: Dominique Morris  [id number]  [id number]  Date of Service: 06/11/2020  Chief Complaint  Patient presents with  . Hypothyroidism  . Follow-up    Labs     HPI Patient is here for routine follow-up Mom is present during exam today Continues to have symptoms of fatigue and weight gain/difficulty losing weight Since our last visit and after reviewing her labs, levothyroxine dose was increased to 100 mcg daily All other labs reviewed, pituitary hormones normal, free T3 normal, free cortisol slightly elevated at 44 History of Hashimoto's thyroiditis, mother has a history of Cushing disease  Has been to see her pediatric endocrinologist--scheduled to go back in a week  Mother is requesting MRI of pituitary due to elevated cortisol levels and her history of Cushing disease, discussed allowing endocrinologist to evaluate and proceed with management   Current Medication: Outpatient Encounter Medications as of 06/09/2020  Medication Sig  . Adapalene-Benzoyl Peroxide (EPIDUO FORTE) 0.3-2.5 % GEL Apply 1 application topically at bedtime.  Marland Kitchen doxycycline (ADOXA) 150 MG tablet Take 1 tablet (150 mg total) by mouth daily.  . Doxycycline Hyclate 150 MG TABS Take 150 mg by mouth daily.  Marland Kitchen ibuprofen (ADVIL) 800 MG tablet Take 1 tablet (800 mg total) by mouth every 8 (eight) hours as needed for cramping.  Marland Kitchen levothyroxine (SYNTHROID) 100 MCG tablet Take 1 tablet (100 mcg total) by mouth daily.  . mometasone (ELOCON) 0.1 % lotion Apply topically daily.  . norgestimate-ethinyl estradiol (ORTHO-CYCLEN) 0.25-35 MG-MCG tablet Take 1 tablet by mouth daily.  . [DISCONTINUED] nitrofurantoin, macrocrystal-monohydrate, (MACROBID) 100 MG capsule Take one tab po bid x 5 days (Patient not taking: Reported on 06/09/2020)   No facility-administered encounter medications on file as of 06/09/2020.     Surgical History: Past Surgical History:  Procedure Laterality Date  . OSTEOCHONDROMA EXCISION  02/2014  . WISDOM TOOTH EXTRACTION  08/15/2019    Medical History: Past Medical History:  Diagnosis Date  . Dysmenorrhea   . Thyroid disease     Family History: Family History  Problem Relation Age of Onset  . Endometriosis Mother   . Hyperlipidemia Father   . Endometriosis Maternal Aunt   . Breast cancer Neg Hx   . Ovarian cancer Neg Hx   . Colon cancer Neg Hx     Social History   Socioeconomic History  . Marital status: Single    Spouse name: Not on file  . Number of children: Not on file  . Years of education: Not on file  . Highest education level: Not on file  Occupational History  . Not on file  Tobacco Use  . Smoking status: Never Smoker  . Smokeless tobacco: Never Used  Vaping Use  . Vaping Use: Never used  Substance and Sexual Activity  . Alcohol use: No  . Drug use: No  . Sexual activity: Yes    Birth control/protection: Pill, Condom  Other Topics Concern  . Not on file  Social History Narrative  . Not on file   Social Determinants of Health   Financial Resource Strain: Not on file  Food Insecurity: Not on file  Transportation Needs: Not on file  Physical Activity: Not on file  Stress: Not on file  Social Connections: Not on file  Intimate Partner Violence: Not on file      Review of Systems  Constitutional: Positive for fatigue and unexpected  weight change. Negative for chills and diaphoresis.  HENT: Negative for ear pain, postnasal drip and sinus pressure.   Eyes: Negative for photophobia, discharge, redness, itching and visual disturbance.  Respiratory: Negative for cough, shortness of breath and wheezing.   Cardiovascular: Negative for chest pain, palpitations and leg swelling.  Gastrointestinal: Negative for abdominal pain, constipation, diarrhea, nausea and vomiting.  Genitourinary: Negative for dysuria and flank pain.   Musculoskeletal: Negative for arthralgias, back pain, gait problem and neck pain.  Skin: Negative for color change.       Dry skin  Allergic/Immunologic: Negative for environmental allergies and food allergies.  Neurological: Negative for dizziness and headaches.  Hematological: Does not bruise/bleed easily.  Psychiatric/Behavioral: Negative for agitation, behavioral problems (depression) and hallucinations.    Vital Signs: BP 139/80   Pulse 80   Temp 98 F (36.7 C)   Resp 16   Ht 5\' 9"  (1.753 m)   Wt 261 lb (118.4 kg)   SpO2 98%   BMI 38.54 kg/m    Physical Exam Vitals reviewed.  Constitutional:      Appearance: Normal appearance. She is obese.  Cardiovascular:     Rate and Rhythm: Normal rate and regular rhythm.     Pulses: Normal pulses.     Heart sounds: Normal heart sounds.  Pulmonary:     Effort: Pulmonary effort is normal.     Breath sounds: Normal breath sounds.  Abdominal:     General: Abdomen is flat.     Palpations: Abdomen is soft.  Musculoskeletal:        General: Normal range of motion.     Cervical back: Normal range of motion.  Skin:    General: Skin is warm.  Neurological:     General: No focal deficit present.     Mental Status: She is alert and oriented to person, place, and time. Mental status is at baseline.  Psychiatric:        Mood and Affect: Mood normal.        Behavior: Behavior normal.        Thought Content: Thought content normal.        Judgment: Judgment normal.    Assessment/Plan: 1. Hypothyroidism due to Hashimoto's thyroiditis Remains symptomatic with increased dose of levothyroxine, will obtain thyroid for further evaluation, will await follow-up with endocrinology before making further updates to medications - US THYROID; Future  2. Elevated urinary free cortisol level Advised to discuss abnormal levels with endocrinologist//MRI  General Counseling: Dominique Morris verbalizes understanding of the findings of todays visit and  agrees with plan of treatment. I have discussed any further diagnostic evaluation that may be needed or ordered today. We also reviewed her medications today. she has been encouraged to call the office with any questions or concerns that should arise related to todays visit.    Orders Placed This Encounter  Procedures  . US THYROID      Time spent: 30 Minutes   This patient was seen by [place] AGNP-C in Collaboration with Dr Leeanne Deed as a part of collaborative care agreement     Lyndon Code. Tasmin Exantus AGNP-C Internal medicine

## 2020-06-11 ENCOUNTER — Encounter: Payer: Self-pay | Admitting: Hospice and Palliative Medicine

## 2020-06-16 ENCOUNTER — Other Ambulatory Visit: Payer: Self-pay

## 2020-06-16 ENCOUNTER — Ambulatory Visit (INDEPENDENT_AMBULATORY_CARE_PROVIDER_SITE_OTHER): Payer: BC Managed Care – PPO

## 2020-06-16 DIAGNOSIS — E063 Autoimmune thyroiditis: Secondary | ICD-10-CM

## 2020-06-16 DIAGNOSIS — E038 Other specified hypothyroidism: Secondary | ICD-10-CM

## 2020-06-22 ENCOUNTER — Ambulatory Visit: Payer: BC Managed Care – PPO | Admitting: Dermatology

## 2020-06-22 ENCOUNTER — Other Ambulatory Visit: Payer: Self-pay

## 2020-06-22 DIAGNOSIS — L700 Acne vulgaris: Secondary | ICD-10-CM | POA: Diagnosis not present

## 2020-06-22 DIAGNOSIS — L409 Psoriasis, unspecified: Secondary | ICD-10-CM

## 2020-06-22 MED ORDER — DOXYCYCLINE MONOHYDRATE 150 MG PO TABS: 150.0000 mg | ORAL_TABLET | Freq: Every day | ORAL | 2 refills | Status: DC

## 2020-06-22 MED ORDER — MOMETASONE FUROATE 0.1 % EX SOLN: Freq: Every day | CUTANEOUS | 2 refills | Status: DC

## 2020-06-22 MED ORDER — ADAPALENE-BENZOYL PEROXIDE 0.3-2.5 % EX GEL: 1.0000 "application " | Freq: Every evening | CUTANEOUS | 2 refills | Status: DC

## 2020-06-22 NOTE — Patient Instructions (Addendum)
Doxycycline should be taken with food to prevent nausea. Do not lay down for 30 minutes after taking. Be cautious with sun exposure and use good sun protection while on this medication. Pregnant women should not take this medication.  

## 2020-06-22 NOTE — Progress Notes (Signed)
   Follow-Up Visit   Subjective  Dominique Morris is a 20 y.o. female who presents for the following: acne follow up (Patient states she is here today for follow up on acne. Patient states she is having some break out due to menstruation. She does feel that doxycycline 150 mg and epiduo forte 0.3 2.5 % gel is helping. ).  But she will breakout more if she stops the antibiotic.  She is currently experiencing a mild flare.   The following portions of the chart were reviewed this encounter and updated as appropriate:        Objective  Well appearing patient in no apparent distress; mood and affect are within normal limits.  A focused examination was performed including face, scalp. Relevant physical exam findings are noted in the Assessment and Plan.  Objective  face: Few small inflammatory papules on perioral area, cheeks and nose.   Objective  left occipital scalp: Mild scale   Assessment & Plan  Acne vulgaris face  Controlled, with monthly flares assoc with cycle Continue doxycycline 150 mg tablet by mouth daily   Doxycycline should be taken with food to prevent nausea. Do not lay down for 30 minutes after taking. Be cautious with sun exposure and use good sun protection while on this medication. Pregnant women should not take this medication.   Continue Adapalene-Benzoyl Peroxide 0.3 - 2.5 % Gel apply 1 application topically at bedtime.   Discussed that she needs to stop chronic doxycycline use, but she states that her skin flares when ever she stops it, and she is not interested in going on accutane.  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  I, Asher Muir, CMA, am acting as scribe for Willeen Niece, MD.  Documentation: I have reviewed the above documentation for accuracy and completeness, and I agree with the above.  Willeen Niece MD

## 2020-06-30 ENCOUNTER — Ambulatory Visit: Payer: Self-pay | Admitting: Hospice and Palliative Medicine

## 2020-07-23 ENCOUNTER — Encounter: Payer: Self-pay | Admitting: Hospice and Palliative Medicine

## 2020-08-06 ENCOUNTER — Telehealth: Payer: Self-pay

## 2020-08-06 NOTE — Telephone Encounter (Signed)
Pt mom advised that we called [facility] for bill they will resubmitted icd 10 codes and send new invoice

## 2020-08-25 ENCOUNTER — Other Ambulatory Visit: Payer: Self-pay

## 2020-08-25 ENCOUNTER — Ambulatory Visit (INDEPENDENT_AMBULATORY_CARE_PROVIDER_SITE_OTHER): Payer: BC Managed Care – PPO | Admitting: Obstetrics and Gynecology

## 2020-08-25 ENCOUNTER — Encounter: Payer: Self-pay | Admitting: Obstetrics and Gynecology

## 2020-08-25 VITALS — BP 118/77 | HR 114 | Ht 69.0 in | Wt 274.4 lb

## 2020-08-25 DIAGNOSIS — R635 Abnormal weight gain: Secondary | ICD-10-CM

## 2020-08-25 DIAGNOSIS — E063 Autoimmune thyroiditis: Secondary | ICD-10-CM | POA: Diagnosis not present

## 2020-08-25 DIAGNOSIS — Z01419 Encounter for gynecological examination (general) (routine) without abnormal findings: Secondary | ICD-10-CM | POA: Diagnosis not present

## 2020-08-25 DIAGNOSIS — N946 Dysmenorrhea, unspecified: Secondary | ICD-10-CM

## 2020-08-25 DIAGNOSIS — Z6841 Body Mass Index (BMI) 40.0 and over, adult: Secondary | ICD-10-CM

## 2020-08-25 DIAGNOSIS — N920 Excessive and frequent menstruation with regular cycle: Secondary | ICD-10-CM

## 2020-08-25 NOTE — Patient Instructions (Signed)
Preventive Care 21-21 Years Old, Female Preventive care refers to lifestyle choices and visits with your health care provider that can promote health and wellness. This includes:  A yearly physical exam. This is also called an annual wellness visit.  Regular dental and eye exams.  Immunizations.  Screening for certain conditions.  Healthy lifestyle choices, such as: ? Eating a healthy diet. ? Getting regular exercise. ? Not using drugs or products that contain nicotine and tobacco. ? Limiting alcohol use. What can I expect for my preventive care visit? Physical exam Your health care provider may check your:  Height and weight. These may be used to calculate your BMI (body mass index). BMI is a measurement that tells if you are at a healthy weight.  Heart rate and blood pressure.  Body temperature.  Skin for abnormal spots. Counseling Your health care provider may ask you questions about your:  Past medical problems.  Family's medical history.  Alcohol, tobacco, and drug use.  Emotional well-being.  Home life and relationship well-being.  Sexual activity.  Diet, exercise, and sleep habits.  Work and work Statistician.  Access to firearms.  Method of birth control.  Menstrual cycle.  Pregnancy history. What immunizations do I need? Vaccines are usually given at various ages, according to a schedule. Your health care provider will recommend vaccines for you based on your age, medical history, and lifestyle or other factors, such as travel or where you work.   What tests do I need? Blood tests  Lipid and cholesterol levels. These may be checked every 5 years starting at age 21.  Hepatitis C test.  Hepatitis B test. Screening  Diabetes screening. This is done by checking your blood sugar (glucose) after you have not eaten for a while (fasting).  STD (sexually transmitted disease) testing, if you are at risk.  BRCA-related cancer screening. This may be  done if you have a family history of breast, ovarian, tubal, or peritoneal cancers.  Pelvic exam and Pap test. This may be done every 3 years starting at age 21. Starting at age 36, this may be done every 5 years if you have a Pap test in combination with an HPV test. Talk with your health care provider about your test results, treatment options, and if necessary, the need for more tests.   Follow these instructions at home: Eating and drinking  Eat a healthy diet that includes fresh fruits and vegetables, whole grains, lean protein, and low-fat dairy products.  Take vitamin and mineral supplements as recommended by your health care provider.  Do not drink alcohol if: ? Your health care provider tells you not to drink. ? You are pregnant, may be pregnant, or are planning to become pregnant.  If you drink alcohol: ? Limit how much you have to 0-1 drink a day. ? Be aware of how much alcohol is in your drink. In the [place] one drink equals one 12 oz bottle of beer (355 mL), one 5 oz glass of wine (148 mL), or one 1 oz glass of hard liquor (44 mL).   Lifestyle  Take daily care of your teeth and gums. Brush your teeth every morning and night with fluoride toothpaste. Floss one time each day.  Stay active. Exercise for at least 30 minutes 5 or more days each week.  Do not use any products that contain nicotine or tobacco, such as cigarettes, e-cigarettes, and chewing tobacco. If you need help quitting, ask your health care provider.  Do not  use drugs.  If you are sexually active, practice safe sex. Use a condom or other form of protection to prevent STIs (sexually transmitted infections).  If you do not wish to become pregnant, use a form of birth control. If you plan to become pregnant, see your health care provider for a prepregnancy visit.  Find healthy ways to cope with stress, such as: ? Meditation, yoga, or listening to music. ? Journaling. ? Talking to a trusted  person. ? Spending time with friends and family. Safety  Always wear your seat belt while driving or riding in a vehicle.  Do not drive: ? If you have been drinking alcohol. Do not ride with someone who has been drinking. ? When you are tired or distracted. ? While texting.  Wear a helmet and other protective equipment during sports activities.  If you have firearms in your house, make sure you follow all gun safety procedures.  Seek help if you have been physically or sexually abused. What's next?  Go to your health care provider once a year for an annual wellness visit.  Ask your health care provider how often you should have your eyes and teeth checked.  Stay up to date on all vaccines. This information is not intended to replace advice given to you by your health care provider. Make sure you discuss any questions you have with your health care provider. Document Revised: 01/11/2020 Document Reviewed: 01/24/2018 Elsevier Patient Education  2021 Elsevier Inc.  

## 2020-08-25 NOTE — Progress Notes (Signed)
GYNECOLOGY ANNUAL PHYSICAL EXAM PROGRESS NOTE  Subjective:    Dominique Morris is a 20 y.o. G0P0000 female with a history of dysmenorrhea and thyroid disease who presents for an annual exam. The patient is sexually active (1 partner).  The patient wears seatbelts: yes. Has the patient ever been transfused or tattooed?: no. The patient reports that there is not domestic violence in her life.   The patient has the following complaints today.  1. 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 Weight at that time was ~ 180 lbs. Notes between August and December, she had gained 50 lbs.  Between December and now, has gained an additional almost 30-50 lbs. Only eats ~ 2 meals a day due to her schedule.  Does have a h/o thyroid disease, currently on medication. Notes thyroid levels were abnormal recently and medications were adjusted. Levels were repeated and were actually worse.  Patient then had a thyroid scan that noted a 2 cm thyroid nodule on right lobe at [facility] (by [facility]), had another scan at [facility] which noted that she had a "false nodule" and was told that they would just follow it.  Other considerations were Cushing's (as her mother also has this). Patient has been noticing an increase in stretch marks lately. Has had 2 urinary cortisol and 1 saliva cortisol tests done. Is planning steroid (dexamethasone) testing soon. Recently discontinued her OCPs so that she can take the Dexamethasone, has been off for 6 weeks.Also desires to be screened for PCOS.   2. Now noting cramps after her cycles since coming off OCPs, but trying to manage.    Gynecologic History Patient's last menstrual period was 08/10/2020. Menarche age: 65 or 65.  Contraception: none History of STI's: Denies Last Pap: Patient has never had one.    OB History  Gravida Para Term Preterm AB Living  0 0 0 0 0 0  SAB IAB Ectopic Multiple Live Births  0 0 0 0  0    Past Medical History:  Diagnosis Date  . Dysmenorrhea   . Thyroid disease     Past Surgical History:  Procedure Laterality Date  . OSTEOCHONDROMA EXCISION  02/2014  . WISDOM TOOTH EXTRACTION  08/15/2019    Family History  Problem Relation Age of Onset  . Endometriosis Mother   . Hyperlipidemia Father   . Endometriosis Maternal Aunt   . Breast cancer Neg Hx   . Ovarian cancer Neg Hx   . Colon cancer Neg Hx     Social History   Socioeconomic History  . Marital status: Single    Spouse name: Not on file  . Number of children: Not on file  . Years of education: Not on file  . Highest education level: Not on file  Occupational History  . Not on file  Tobacco Use  . Smoking status: Never Smoker  . Smokeless tobacco: Never Used  Vaping Use  . Vaping Use: Never used  Substance and Sexual Activity  . Alcohol use: No  . Drug use: No  . Sexual activity: Yes    Birth control/protection: Pill, Condom  Other Topics Concern  . Not on file  Social History Narrative  . Not on file   Social Determinants of Health   Financial Resource Strain: Not on file  Food Insecurity: Not on file  Transportation Needs: Not on file  Physical Activity: Not on file  Stress: Not on  file  Social Connections: Not on file  Intimate Partner Violence: Not on file    Current Outpatient Medications on File Prior to Visit  Medication Sig Dispense Refill  . Adapalene-Benzoyl Peroxide (EPIDUO FORTE) 0.3-2.5 % GEL Apply 1 application topically at bedtime. 60 g 2  . doxycycline (ADOXA) 150 MG tablet Take 1 tablet (150 mg total) by mouth daily. 30 tablet 2  . ibuprofen (ADVIL) 800 MG tablet Take 1 tablet (800 mg total) by mouth every 8 (eight) hours as needed for cramping. 60 tablet 3  . levothyroxine (SYNTHROID) 100 MCG tablet Take 1 tablet (100 mcg total) by mouth daily. 90 tablet 3  . mometasone (ELOCON) 0.1 % lotion Apply topically daily. 60 mL 2  . norgestimate-ethinyl estradiol  (ORTHO-CYCLEN) 0.25-35 MG-MCG tablet Take 1 tablet by mouth daily. (Patient not taking: Reported on 08/25/2020) 84 tablet 3   No current facility-administered medications on file prior to visit.    Allergies  Allergen Reactions  . Latex     Other reaction(s): Other (See Comments) Other Reaction: Sesitivity      Review of Systems Constitutional: negative for chills, fatigue, fevers and sweats. Positive for weight gain.  Eyes: negative for irritation, redness and visual disturbance Ears, nose, mouth, throat, and face: negative for hearing loss, nasal congestion, snoring and tinnitus Respiratory: negative for asthma, cough, sputum Cardiovascular: negative for chest pain, dyspnea, exertional chest pressure/discomfort, irregular heart beat, palpitations and syncope Gastrointestinal: negative for abdominal pain, change in bowel habits, nausea and vomiting Genitourinary: negative for abnormal menstrual periods, genital lesions, sexual problems and vaginal discharge, dysuria and urinary incontinence.  Integument/breast: negative for breast lump, breast tenderness and nipple discharge. Hematologic/lymphatic: negative for bleeding and easy bruising Musculoskeletal:negative for back pain and muscle weakness Neurological: negative for dizziness, headaches, vertigo and weakness Endocrine: negative for diabetic symptoms including polydipsia, polyuria and skin dryness Allergic/Immunologic: negative for hay fever and urticaria        Objective:  Blood pressure 118/77, pulse (!) 114, height 5\' 9"  (1.753 m), weight 274 lb 6.4 oz (124.5 kg), last menstrual period 08/10/2020. Body mass index is 40.52 kg/m.  General Appearance:    Alert, cooperative, no distress, appears stated age, morbidly obese.   Head:    Normocephalic, without obvious abnormality, atraumatic  Eyes:    PERRL, conjunctiva/corneas clear, EOM's intact, both eyes  Ears:    Normal external ear canals, both ears  Nose:   Nares normal,  septum midline, mucosa normal, no drainage or sinus tenderness  Throat:   Lips, mucosa, and tongue normal; teeth and gums normal  Neck:   Supple, symmetrical, trachea midline, no adenopathy; thyroid: no enlargement/tenderness/nodules; no carotid bruit or JVD  Back:     Symmetric, no curvature, ROM normal, no CVA tenderness  Lungs:     Clear to auscultation bilaterally, respirations unlabored  Chest Wall:    No tenderness or deformity   Heart:    Regular rate and rhythm, S1 and S2 normal, no murmur, rub or gallop  Breast Exam:    No tenderness, masses, or nipple abnormality  Abdomen:     Soft, non-tender, bowel sounds active all four quadrants, no masses, no organomegaly.    Genitalia:    Pelvic: external genitalia normal appearing, vagina without lesions, discharge, or tenderness, rectovaginal septum  normal. Cervix normal in appearance, no cervical motion tenderness, no adnexal masses or tenderness.  Uterus normal size, shape, mobile, regular contours, nontender.  Rectal:    Normal external sphincter.  No hemorrhoids  appreciated. Internal exam not done.   Extremities:   Extremities normal, atraumatic, no cyanosis or edema  Pulses:   2+ and symmetric all extremities  Skin:   Skin color, texture, turgor normal, no rashes or lesions  Lymph nodes:   Cervical, supraclavicular, and axillary nodes normal  Neurologic:   CNII-XII intact, normal strength, sensation and reflexes throughout   .  Labs:  Lab Results  Component Value Date   WBC 4.4 05/04/2020   HGB 12.9 05/04/2020   HCT 36.9 05/04/2020   MCV 90 05/04/2020   PLT 175 05/04/2020    Lab Results  Component Value Date   CREATININE 0.97 05/04/2020   BUN 13 05/04/2020   NA 137 05/04/2020   K 4.1 05/04/2020   CL 104 05/04/2020   CO2 23 05/04/2020    Lab Results  Component Value Date   ALT 77 (H) 05/04/2020   AST 36 05/04/2020   ALKPHOS 72 05/04/2020   BILITOT 0.2 05/04/2020    Lab Results  Component Value Date   TSH 11.200  (H) 05/04/2020     Assessment:   1. Encounter for well woman exam with routine gynecological exam   2. Weight gain   3. Menorrhagia with regular cycle   4. Acquired autoimmune hypothyroidism   5. Dysmenorrhea    Plan:    Blood tests: CBC with diff and Comprehensive metabolic panel.   Contraception: Using fertility tracking and withdrawal method as she is currently holding off on OCP use due to upcoming testing.   Discussed healthy lifestyle modifications. Patient's thyroid disease managed by Endocrinologist. Undergoing workup due to family history of Cushing's and recent new onset of symptoms of weight gain and striations. Patient also desires to have workup to rule out PCOS. Will order labs and ultrasound as indicated.  Pap smear: performed today as she will be 20 in next several months.   Dysmenorrhea, can utilize NSAIDs/Tylenol until she can resume OCPs.  Menorrhagia with regular cycles, previously managed on OCPs, can resume once testing is completed.  Declines STI testing.  Declines flu vaccine.  Declines COVID vaccination.  Follow up in 1 year for annual exam, or sooner as needed.   Hildred Laser, MD [facility]

## 2020-08-25 NOTE — Progress Notes (Signed)
Pt present for annual exam. Pt stated that she stopped taking her birth control pills about 6 weeks ago. Pt stated that she will restart after her labs and find out what is currently going on with her health at this time.

## 2020-09-24 ENCOUNTER — Other Ambulatory Visit: Payer: Self-pay

## 2020-09-24 ENCOUNTER — Other Ambulatory Visit: Payer: BC Managed Care – PPO

## 2020-09-27 ENCOUNTER — Ambulatory Visit: Payer: BC Managed Care – PPO | Admitting: Dermatology

## 2020-10-01 ENCOUNTER — Other Ambulatory Visit: Payer: Self-pay

## 2020-10-01 ENCOUNTER — Ambulatory Visit
Admission: RE | Admit: 2020-10-01 | Discharge: 2020-10-01 | Disposition: A | Discharge status: Home / Self Care | Payer: BC Managed Care – PPO | Source: Ambulatory Visit | Attending: Obstetrics and Gynecology | Admitting: Obstetrics and Gynecology

## 2020-10-01 DIAGNOSIS — N920 Excessive and frequent menstruation with regular cycle: Secondary | ICD-10-CM | POA: Diagnosis present

## 2020-10-01 DIAGNOSIS — R635 Abnormal weight gain: Secondary | ICD-10-CM | POA: Insufficient documentation

## 2020-10-02 LAB — TESTOSTERONE, FREE, TOTAL, SHBG
Sex Hormone Binding: 24.5 nmol/L — ABNORMAL LOW (ref 24.6–122.0)
Testosterone, Free: 2.9 pg/mL (ref 0.0–4.2)
Testosterone: 45 ng/dL (ref 13–71)

## 2020-10-02 LAB — PROGESTERONE: Progesterone: 1.9 ng/mL

## 2020-10-02 LAB — INSULIN, FREE AND TOTAL
Free Insulin: 9.5 uU/mL
Total Insulin: 9.5 uU/mL

## 2020-10-02 LAB — DHEA-SULFATE: DHEA-SO4: 480 ug/dL — ABNORMAL HIGH (ref 110.0–431.7)

## 2020-10-02 LAB — ESTRADIOL: Estradiol: 122 pg/mL

## 2020-10-14 ENCOUNTER — Telehealth: Payer: Self-pay | Admitting: Obstetrics and Gynecology

## 2020-10-14 ENCOUNTER — Other Ambulatory Visit: Payer: Self-pay | Admitting: Obstetrics and Gynecology

## 2020-10-14 MED ORDER — NORGESTIMATE-ETH ESTRADIOL 0.25-35 MG-MCG PO TABS: 1.0000 | ORAL_TABLET | Freq: Every day | ORAL | 3 refills | Status: DC

## 2020-10-14 NOTE — Telephone Encounter (Signed)
Pt called no answer LM via VM that her medication/birth control had been refilled and sent to [facility] in [place]. Also sent pt a mychart message.

## 2020-10-14 NOTE — Telephone Encounter (Signed)
Pt called asking if she could get a refill of her birth control.  She stated she was here in march for her physical. Merilynn Finland [facility]

## 2020-11-10 ENCOUNTER — Encounter: Payer: Self-pay | Admitting: Dermatology

## 2020-11-10 ENCOUNTER — Other Ambulatory Visit: Payer: Self-pay

## 2020-11-10 ENCOUNTER — Ambulatory Visit: Payer: BC Managed Care – PPO | Admitting: Dermatology

## 2020-11-10 DIAGNOSIS — L700 Acne vulgaris: Secondary | ICD-10-CM

## 2020-11-10 MED ORDER — DOXYCYCLINE MONOHYDRATE 150 MG PO TABS: 150.0000 mg | ORAL_TABLET | Freq: Every day | ORAL | 2 refills | Status: DC

## 2020-11-10 MED ORDER — TRETINOIN 0.05 % EX CREA: TOPICAL_CREAM | Freq: Every day | CUTANEOUS | 3 refills | Status: DC

## 2020-11-10 MED ORDER — WINLEVI 1 % EX CREA: TOPICAL_CREAM | CUTANEOUS | 2 refills | Status: DC

## 2020-11-10 NOTE — Patient Instructions (Addendum)
Topical retinoid medications like tretinoin/Retin-A, adapalene/Differin, tazarotene/Fabior, and Epiduo/Epiduo Forte can cause dryness and irritation when first started. Only apply a pea-sized amount to the entire affected area. Avoid applying it around the eyes, edges of mouth and creases at the nose. If you experience irritation, use a good moisturizer first and/or apply the medicine less often. If you are doing well with the medicine, you can increase how often you use it until you are applying every night. Be careful with sun protection while using this medication as it can make you sensitive to the sun. This medicine should not be used by pregnant women.    Discuss changing birth control to [facility], with primary care doctor/GYN.  Doxycycline should be taken with food to prevent nausea. Do not lay down for 30 minutes after taking. Be cautious with sun exposure and use good sun protection while on this medication. Pregnant women should not take this medication.      If you have any questions or concerns for your doctor, please call our main line at [phone] and press option 4 to reach your doctor's medical assistant. If no one answers, please leave a voicemail as directed and we will return your call as soon as possible. Messages left after 4 pm will be answered the following business day.   You may also send [place] a message via MyChart. We typically respond to MyChart messages within 1-2 business days.  For prescription refills, please ask your pharmacy to contact our office. Our fax number is [phone].  If you have an urgent issue when the clinic is closed that cannot wait until the next business day, you can page your doctor at the number below.    Please note that while we do our best to be available for urgent issues outside of office hours, we are not available 24/7.   If you have an urgent issue and are unable to reach [place], you may choose to seek medical care at your doctor's office,  retail clinic, urgent care center, or emergency room.  If you have a medical emergency, please immediately call 911 or go to the emergency department.  Pager Numbers  - Dr. Gwen Pounds: [phone]  - Dr. Neale Burly: [phone]  - Dr. Roseanne Reno: [phone]  In the event of inclement weather, please call our main line at [phone] for an update on the status of any delays or closures.  Dermatology Medication Tips: Please keep the boxes that topical medications come in in order to help keep track of the instructions about where and how to use these. Pharmacies typically print the medication instructions only on the boxes and not directly on the medication tubes.   If your medication is too expensive, please contact our office at [phone] option 4 or send [place] a message through MyChart.   We are unable to tell what your co-pay for medications will be in advance as this is different depending on your insurance coverage. However, we may be able to find a substitute medication at lower cost or fill out paperwork to get insurance to cover a needed medication.   If a prior authorization is required to get your medication covered by your insurance company, please allow [place] 1-2 business days to complete this process.  Drug prices often vary depending on where the prescription is filled and some pharmacies may offer cheaper prices.  The website www.goodrx.com contains coupons for medications through different pharmacies. The prices here do not account for what the cost may be with help  from insurance (it may be cheaper with your insurance), but the website can give you the price if you did not use any insurance.  - You can print the associated coupon and take it with your prescription to the pharmacy.  - You may also stop by our office during regular business hours and pick up a GoodRx coupon card.  - If you need your prescription sent electronically to a different pharmacy, notify our office through  [facility] or by phone at [phone] option 4.

## 2020-11-10 NOTE — Progress Notes (Signed)
   Follow-Up Visit   Subjective  Dominique Morris is a 20 y.o. female who presents for the following: Acne (Face. 5 month recheck. Out of Doxycycline since 10/24/2020. Flaring. Has only been able to use Epiduo once every couple of weeks. Has dry, sensitive skin due to thyroid issues. Epiduo causes excessive dryness. ). Has been experiencing health issues since last visit. Dx with PCOS since last visit. Waiting to see if has cushing's disease.  Has gained over 100# over the past year.     The following portions of the chart were reviewed this encounter and updated as appropriate:       Review of Systems: No other skin or systemic complaints except as noted in HPI or Assessment and Plan.   Objective  Well appearing patient in no apparent distress; mood and affect are within normal limits.  A focused examination was performed including face, neck, chest and back. Relevant physical exam findings are noted in the Assessment and Plan.  face Cystic nodules right chin and jaw, scattered inflammatory papules and closed comedones on chin jaw, cheeks and forehead.   Assessment & Plan  Acne vulgaris face  Chronic and persistent, flare off of doxy  Discussed adding Spironolactone for hormonal acne. Patient defers at this time. Doesn't want to add more oral medication until figures out current health issues related to weight gain  Start Winlevi cream BID  Start Tretinoin 0.05% cream QHS  Resume Doxcycline 150mg  1 po qd with food for now, discussed risk antibiotic resistance with long term use  Discuss changing OCP to [facility] with PCP for increased anti-androgen effect.  Topical retinoid medications like tretinoin/Retin-A, adapalene/Differin, tazarotene/Fabior, and Epiduo/Epiduo Forte can cause dryness and irritation when first started. Only apply a pea-sized amount to the entire affected area. Avoid applying it around the eyes, edges of mouth and creases at the nose. If you experience irritation,  use a good moisturizer first and/or apply the medicine less often. If you are doing well with the medicine, you can increase how often you use it until you are applying every night. Be careful with sun protection while using this medication as it can make you sensitive to the sun. This medicine should not be used by pregnant women.     Doxycycline should be taken with food to prevent nausea. Do not lay down for 30 minutes after taking. Be cautious with sun exposure and use good sun protection while on this medication. Pregnant women should not take this medication.       Clascoterone (WINLEVI) 1 % CREA - face Apply BID to face  tretinoin (RETIN-A) 0.05 % cream - face Apply topically at bedtime.  Related Medications doxycycline (ADOXA) 150 MG tablet Take 1 tablet (150 mg total) by mouth daily.  Return in about 10 weeks (around 01/19/2021) for acne recheck.  I, 01/21/2021, CMA, am acting as scribe for Lawson Radar, MD.  Documentation: I have reviewed the above documentation for accuracy and completeness, and I agree with the above.  Willeen Niece MD

## 2020-11-24 ENCOUNTER — Other Ambulatory Visit: Payer: Self-pay

## 2020-11-24 ENCOUNTER — Ambulatory Visit: Payer: BC Managed Care – PPO | Admitting: Nurse Practitioner

## 2020-11-24 ENCOUNTER — Encounter: Payer: Self-pay | Admitting: Nurse Practitioner

## 2020-11-24 VITALS — BP 118/66 | HR 80 | Temp 98.3°F | Resp 16 | Ht 68.0 in | Wt 272.2 lb

## 2020-11-24 DIAGNOSIS — E041 Nontoxic single thyroid nodule: Secondary | ICD-10-CM

## 2020-11-24 DIAGNOSIS — E063 Autoimmune thyroiditis: Secondary | ICD-10-CM | POA: Diagnosis not present

## 2020-11-24 DIAGNOSIS — E038 Other specified hypothyroidism: Secondary | ICD-10-CM | POA: Diagnosis not present

## 2020-11-24 NOTE — Progress Notes (Signed)
[facility] [address] [place], [place] [address]  Internal MEDICINE  Office Visit Note  Patient Name: Dominique Morris  [id number]  [id number]  Date of Service: 11/24/2020  Chief Complaint  Patient presents with   Acute Visit    Thyroid issue, neck feeling weird, for 3 days the middle of throat feel slight pressure, patient dose not have any issue with breathing, when patient lays on her back and side she can feel something in her throat.     HPI Dominique Morris presents to the clinic with an issue with her thyroid. She has an endocrinologist at [facility] that she see who manages her thyroid. Her endocrinologist has her taking T3 and levothyroxine. She has a ultrasound done of her thyroid because it feels like it is pushing against her throat. The ultrasound recommendation was fine needle aspiration of the nodule that was found. Her endocrinologist told her he would not do a FNA/biopsy. She still feels like her thyroid is pushing up against her throat. She wants to have another ultrasound done and possibly obtain a second opinion from another endocrinologist.     Current Medication:  Outpatient Encounter Medications as of 11/24/2020  Medication Sig   Clascoterone (WINLEVI) 1 % CREA Apply BID to face   doxycycline (ADOXA) 150 MG tablet Take 1 tablet (150 mg total) by mouth daily.   ibuprofen (ADVIL) 800 MG tablet Take 1 tablet (800 mg total) by mouth every 8 (eight) hours as needed for cramping.   levothyroxine (SYNTHROID) 100 MCG tablet Take 1 tablet (100 mcg total) by mouth daily.   levothyroxine (SYNTHROID) 125 MCG tablet Take by mouth.   liothyronine (CYTOMEL) 5 MCG tablet Take by mouth.   mometasone (ELOCON) 0.1 % lotion Apply topically daily.   norgestimate-ethinyl estradiol (ORTHO-CYCLEN) 0.25-35 MG-MCG tablet Take 1 tablet by mouth daily.   Semaglutide,0.25 or 0.5MG /DOS, 2 MG/1.5ML SOPN Inject into the skin.   tretinoin (RETIN-A) 0.05 % cream Apply topically at bedtime.    No facility-administered encounter medications on file as of 11/24/2020.      Medical History: Past Medical History:  Diagnosis Date   Dysmenorrhea    PCOS (polycystic ovarian syndrome)    Thyroid disease      Vital Signs: BP 118/66   Pulse 80   Temp 98.3 F (36.8 C)   Resp 16   Ht 5\' 8"  (1.727 m)   Wt 272 lb 3.2 oz (123.5 kg)   SpO2 98%   BMI 41.39 kg/m    Review of Systems  Constitutional:  Negative for chills, fatigue and unexpected weight change.  HENT:  Negative for congestion, rhinorrhea, sneezing and sore throat.   Eyes:  Negative for redness.  Respiratory:  Negative for cough, chest tightness and shortness of breath.   Cardiovascular:  Negative for chest pain and palpitations.  Gastrointestinal:  Negative for abdominal pain, constipation, diarrhea, nausea and vomiting.  Genitourinary:  Negative for dysuria and frequency.  Musculoskeletal:  Negative for arthralgias, back pain, joint swelling and neck pain.  Skin:  Negative for rash.  Neurological: Negative.  Negative for tremors and numbness.  Hematological:  Negative for adenopathy. Does not bruise/bleed easily.  Psychiatric/Behavioral:  Negative for behavioral problems (Depression), sleep disturbance and suicidal ideas. The patient is not nervous/anxious.    Physical Exam Vitals reviewed.  Constitutional:      General: She is not in acute distress.    Appearance: Normal appearance. She is obese. She is not ill-appearing.  HENT:  Head: Normocephalic and atraumatic.  Neck:     Thyroid: Thyromegaly present.  Cardiovascular:     Rate and Rhythm: Normal rate and regular rhythm.     Pulses: Normal pulses.     Heart sounds: No murmur heard. Pulmonary:     Effort: Pulmonary effort is normal. No respiratory distress.     Breath sounds: Normal breath sounds. No wheezing.  Lymphadenopathy:     Cervical: No cervical adenopathy.  Skin:    General: Skin is warm and dry.     Capillary Refill: Capillary  refill takes less than 2 seconds.  Neurological:     Mental Status: She is alert and oriented to person, place, and time.  Psychiatric:        Mood and Affect: Mood normal.        Behavior: Behavior normal.      Assessment/Plan: 1. Thyroid nodule Most recent thyroid ultrasound results were T-RADS 4 with a right lobe nodule measuring 1.6 x 0.6 x 0.7 cm and could be considered for FNA. When she asked her current endocrinologist to do the FNA he declined. This ultrasound was done in January 2022. She feels like there is even more pressure where her thyroid gland is pushing against her throat. She wants a repeat thyroid ultrasound and possible second opinion from another endocrinologist.  - US THYROID; Future  2. Hypothyroidism due to Hashimoto's thyroiditis Previously diagnosed being treated by [facility] endocrinology.  - US THYROID; Future   General Counseling: Dominique Morris verbalizes understanding of the findings of todays visit and agrees with plan of treatment. I have discussed any further diagnostic evaluation that may be needed or ordered today. We also reviewed her medications today. she has been encouraged to call the office with any questions or concerns that should arise related to todays visit.    Counseling:    Orders Placed This Encounter  Procedures   US THYROID    No orders of the defined types were placed in this encounter.  Return if symptoms worsen or fail to improve, for and , U/S @ [place], pt requests and is ok to receive results by phone .   [place] Controlled Substance Database was reviewed by me.  Time spent:20 Minutes  This patient was seen by Sallyanne Kuster, FNP-C in collaboration with Dr. Beverely Risen as a part of collaborative care agreement.  Ashawnti Tangen R. Tedd Sias, MSN, FNP-C Internal Medicine

## 2020-11-30 ENCOUNTER — Telehealth: Payer: Self-pay

## 2020-11-30 NOTE — Telephone Encounter (Signed)
Left vm to screen for 12/01/20 appointment-Toni 

## 2020-12-01 ENCOUNTER — Other Ambulatory Visit: Payer: Self-pay

## 2020-12-01 ENCOUNTER — Ambulatory Visit (INDEPENDENT_AMBULATORY_CARE_PROVIDER_SITE_OTHER): Payer: BC Managed Care – PPO

## 2020-12-01 DIAGNOSIS — E041 Nontoxic single thyroid nodule: Secondary | ICD-10-CM | POA: Diagnosis not present

## 2020-12-01 DIAGNOSIS — E038 Other specified hypothyroidism: Secondary | ICD-10-CM | POA: Diagnosis not present

## 2020-12-01 DIAGNOSIS — E063 Autoimmune thyroiditis: Secondary | ICD-10-CM | POA: Diagnosis not present

## 2020-12-08 ENCOUNTER — Encounter: Payer: Self-pay | Admitting: Nurse Practitioner

## 2020-12-08 ENCOUNTER — Telehealth (INDEPENDENT_AMBULATORY_CARE_PROVIDER_SITE_OTHER): Payer: BC Managed Care – PPO | Admitting: Nurse Practitioner

## 2020-12-08 VITALS — Ht 69.0 in | Wt 270.0 lb

## 2020-12-08 DIAGNOSIS — E063 Autoimmune thyroiditis: Secondary | ICD-10-CM

## 2020-12-08 DIAGNOSIS — E041 Nontoxic single thyroid nodule: Secondary | ICD-10-CM

## 2020-12-08 DIAGNOSIS — E038 Other specified hypothyroidism: Secondary | ICD-10-CM

## 2020-12-08 NOTE — Progress Notes (Signed)
[facility] [address] [place], [place] [address]  Internal MEDICINE  Telephone Visit  Patient Name: Dominique Morris  [id number]  [id number]  Date of Service: 12/08/2020  I connected with the patient at 09:35 AM by telephone and verified the patients identity using two identifiers.   I discussed the limitations, risks, security and privacy concerns of performing an evaluation and management service by telephone and the availability of in person appointments. I also discussed with the patient that there may be a patient responsible charge related to the service.  The patient expressed understanding and agrees to proceed.    Chief Complaint  Patient presents with   Telephone Assessment    [phone]   Telephone Screen   Follow-up    U/S   Quality Metric Gaps    Physical with pap     HPI Dominique Morris presents via virtual video visits to discuss thyroid ultrasound results. The nodule seen on the most recent ultrasound is too small for fine needle aspiration. T-RADS 1, FNA not recommended. Her endocrinologist is with [facility], will send thyroid ultrasound results to her endocrinologist.  Patient is advised that she needs to call the main clinic number to schedule her annual physical exam for her next appointment.  Dominique Morris has no other questions or concerns at this time.   Current Medication: Outpatient Encounter Medications as of 12/08/2020  Medication Sig   Clascoterone (WINLEVI) 1 % CREA Apply BID to face   doxycycline (ADOXA) 150 MG tablet Take 1 tablet (150 mg total) by mouth daily.   ibuprofen (ADVIL) 800 MG tablet Take 1 tablet (800 mg total) by mouth every 8 (eight) hours as needed for cramping.   levothyroxine (SYNTHROID) 100 MCG tablet Take 1 tablet (100 mcg total) by mouth daily.   levothyroxine (SYNTHROID) 125 MCG tablet Take by mouth.   liothyronine (CYTOMEL) 5 MCG tablet Take by mouth.   mometasone (ELOCON) 0.1 % lotion Apply topically daily.   norgestimate-ethinyl  estradiol (ORTHO-CYCLEN) 0.25-35 MG-MCG tablet Take 1 tablet by mouth daily.   [EXPIRED] Semaglutide,0.25 or 0.5MG /DOS, 2 MG/1.5ML SOPN Inject into the skin.   tretinoin (RETIN-A) 0.05 % cream Apply topically at bedtime.   No facility-administered encounter medications on file as of 12/08/2020.    Surgical History: Past Surgical History:  Procedure Laterality Date   OSTEOCHONDROMA EXCISION  02/2014   WISDOM TOOTH EXTRACTION  08/15/2019    Medical History: Past Medical History:  Diagnosis Date   Dysmenorrhea    PCOS (polycystic ovarian syndrome)    Thyroid disease     Family History: Family History  Problem Relation Age of Onset   Endometriosis Mother    Hyperlipidemia Father    Endometriosis Maternal Aunt    Breast cancer Neg Hx    Ovarian cancer Neg Hx    Colon cancer Neg Hx     Social History   Socioeconomic History   Marital status: Single    Spouse name: Not on file   Number of children: Not on file   Years of education: Not on file   Highest education level: Not on file  Occupational History   Not on file  Tobacco Use   Smoking status: Never   Smokeless tobacco: Never  Vaping Use   Vaping Use: Never used  Substance and Sexual Activity   Alcohol use: No   Drug use: No   Sexual activity: Yes    Birth control/protection: Pill, Condom  Other Topics Concern   Not on file  Social History Narrative   Not on file   Social Determinants of Health   Financial Resource Strain: Not on file  Food Insecurity: Not on file  Transportation Needs: Not on file  Physical Activity: Not on file  Stress: Not on file  Social Connections: Not on file  Intimate Partner Violence: Not on file      Review of Systems  Constitutional:  Negative for chills, fatigue and unexpected weight change.  HENT:  Negative for congestion, rhinorrhea, sneezing and sore throat.        Neck/thyroid "feels weird"  Eyes:  Negative for redness.  Respiratory:  Negative for cough, chest  tightness and shortness of breath.   Cardiovascular:  Negative for chest pain and palpitations.  Gastrointestinal:  Negative for abdominal pain, constipation, diarrhea, nausea and vomiting.  Genitourinary:  Negative for dysuria and frequency.  Musculoskeletal:  Negative for arthralgias, back pain, joint swelling and neck pain.  Skin:  Negative for rash.  Neurological: Negative.  Negative for tremors and numbness.  Hematological:  Negative for adenopathy. Does not bruise/bleed easily.  Psychiatric/Behavioral:  Negative for behavioral problems (Depression), sleep disturbance and suicidal ideas. The patient is not nervous/anxious.    Vital Signs: Ht 5\' 9"  (1.753 m)   Wt 270 lb (122.5 kg)   BMI 39.87 kg/m    Observation/Objective: Dominique Morris is alert and oriented, and engages in conversation appropriately. She appears in no acute distress over video call.    Assessment/Plan: 1. Hypothyroidism due to Hashimoto's thyroiditis Managed by [facility] endocrinology, continue to follow up as directed by their office. No new orders  2. Thyroid nodule Discussed ultrasound results, FNA not recommended at this time, will send results to [facility] endocrinology. No new orders   General Counseling: Dominique Morris verbalizes understanding of the findings of today's phone visit and agrees with plan of treatment. I have discussed any further diagnostic evaluation that may be needed or ordered today. We also reviewed her medications today. she has been encouraged to call the office with any questions or concerns that should arise related to todays visit.    No orders of the defined types were placed in this encounter.   No orders of the defined types were placed in this encounter.   Time spent:30 Minutes  Return in about 4 weeks (around 01/05/2021) for CPE, Dominique Morris PCP.  This patient was seen by 03/07/2021, FNP-C in collaboration with Dr. Sallyanne Kuster as a part of collaborative care agreement.   Quintyn Dombek R.  Beverely Risen, MSN, FNP-C Internal medicine

## 2020-12-30 ENCOUNTER — Telehealth: Payer: Self-pay

## 2020-12-30 NOTE — Telephone Encounter (Signed)
Left vm for 12/31/20 appointment screening-Toni 

## 2020-12-31 ENCOUNTER — Encounter: Payer: BC Managed Care – PPO | Admitting: Nurse Practitioner

## 2021-01-25 ENCOUNTER — Ambulatory Visit: Payer: BC Managed Care – PPO | Admitting: Dermatology

## 2021-02-22 ENCOUNTER — Telehealth: Payer: Self-pay

## 2021-02-22 NOTE — Telephone Encounter (Signed)
Left vm to schedule pulmonary new patient visit-Toni

## 2021-02-24 ENCOUNTER — Telehealth: Payer: Self-pay

## 2021-02-24 NOTE — Telephone Encounter (Signed)
Patient I having stomach pain with constipation. We had availability for tomorrow however the patient stated that they are working and will not be available. I stated the patient can go to the ER if it gets worse. Mother agreed and stated she would call back if they needed an appt.

## 2021-02-25 ENCOUNTER — Other Ambulatory Visit: Payer: Self-pay

## 2021-02-25 ENCOUNTER — Encounter: Payer: Self-pay | Admitting: Nurse Practitioner

## 2021-02-25 ENCOUNTER — Ambulatory Visit: Payer: BC Managed Care – PPO | Admitting: Nurse Practitioner

## 2021-02-25 VITALS — BP 122/82 | HR 82 | Temp 98.0°F | Resp 16 | Ht 68.0 in | Wt 259.8 lb

## 2021-02-25 DIAGNOSIS — R0683 Snoring: Secondary | ICD-10-CM

## 2021-02-25 DIAGNOSIS — R198 Other specified symptoms and signs involving the digestive system and abdomen: Secondary | ICD-10-CM | POA: Diagnosis not present

## 2021-02-25 DIAGNOSIS — R1011 Right upper quadrant pain: Secondary | ICD-10-CM

## 2021-02-25 DIAGNOSIS — Z6839 Body mass index (BMI) 39.0-39.9, adult: Secondary | ICD-10-CM

## 2021-02-25 DIAGNOSIS — E038 Other specified hypothyroidism: Secondary | ICD-10-CM

## 2021-02-25 DIAGNOSIS — L700 Acne vulgaris: Secondary | ICD-10-CM

## 2021-02-25 DIAGNOSIS — R5383 Other fatigue: Secondary | ICD-10-CM

## 2021-02-25 DIAGNOSIS — Z9189 Other specified personal risk factors, not elsewhere classified: Secondary | ICD-10-CM

## 2021-02-25 MED ORDER — DOXYCYCLINE MONOHYDRATE 150 MG PO TABS: 150.0000 mg | ORAL_TABLET | Freq: Every day | ORAL | 2 refills | Status: DC

## 2021-02-25 NOTE — Progress Notes (Signed)
[facility] [address] [place], [place] [address]  Internal MEDICINE  Office Visit Note  Patient Name: Dominique Morris  [id number]  [id number]  Date of Service: 02/25/2021  Chief Complaint  Patient presents with   Follow-up    pt needing to be referred out for gastro and needs a psg study according to other provider she saw else where.    HPI Marleen presents for a follow up visit to get a referral to gastroenterology. She has seen multiple GI providers in the past and is having the same issue as before. She reports early satiety, problems with constipation and diarrhea alternating, abdominal pain and bloating after meals. She also thinks she may be lactose intolerant. She was also told by her specialist that she should ask about getting a sleep study done because she has fatigue, snoring, daytime sleepiness and a family history of OSA.     Current Medication: Outpatient Encounter Medications as of 02/25/2021  Medication Sig   Clascoterone (WINLEVI) 1 % CREA Apply BID to face   ibuprofen (ADVIL) 800 MG tablet Take 1 tablet (800 mg total) by mouth every 8 (eight) hours as needed for cramping.   levothyroxine (SYNTHROID) 137 MCG tablet Take 137 mcg by mouth daily before breakfast.   liothyronine (CYTOMEL) 5 MCG tablet Take by mouth.   mometasone (ELOCON) 0.1 % lotion Apply topically daily.   norgestimate-ethinyl estradiol (ORTHO-CYCLEN) 0.25-35 MG-MCG tablet Take 1 tablet by mouth daily.   OZEMPIC, 1 MG/DOSE, 4 MG/3ML SOPN Inject into the skin.   tretinoin (RETIN-A) 0.05 % cream Apply topically at bedtime.   [DISCONTINUED] doxycycline (ADOXA) 150 MG tablet Take 1 tablet (150 mg total) by mouth daily.   doxycycline (ADOXA) 150 MG tablet Take 1 tablet (150 mg total) by mouth daily.   [DISCONTINUED] levothyroxine (SYNTHROID) 100 MCG tablet Take 1 tablet (100 mcg total) by mouth daily. (Patient not taking: Reported on 02/25/2021)   [DISCONTINUED] levothyroxine (SYNTHROID) 125  MCG tablet Take by mouth. (Patient not taking: Reported on 02/25/2021)   No facility-administered encounter medications on file as of 02/25/2021.    Surgical History: Past Surgical History:  Procedure Laterality Date   OSTEOCHONDROMA EXCISION  02/2014   WISDOM TOOTH EXTRACTION  08/15/2019    Medical History: Past Medical History:  Diagnosis Date   Dysmenorrhea    PCOS (polycystic ovarian syndrome)    Thyroid disease     Family History: Family History  Problem Relation Age of Onset   Endometriosis Mother    Hyperlipidemia Father    Endometriosis Maternal Aunt    Breast cancer Neg Hx    Ovarian cancer Neg Hx    Colon cancer Neg Hx     Social History   Socioeconomic History   Marital status: Single    Spouse name: Not on file   Number of children: Not on file   Years of education: Not on file   Highest education level: Not on file  Occupational History   Not on file  Tobacco Use   Smoking status: Never   Smokeless tobacco: Never  Vaping Use   Vaping Use: Never used  Substance and Sexual Activity   Alcohol use: No   Drug use: No   Sexual activity: Yes    Birth control/protection: Pill, Condom  Other Topics Concern   Not on file  Social History Narrative   Not on file   Social Determinants of Health   Financial Resource Strain: Not on file  Food Insecurity:  Not on file  Transportation Needs: Not on file  Physical Activity: Not on file  Stress: Not on file  Social Connections: Not on file  Intimate Partner Violence: Not on file      Review of Systems  Constitutional:  Positive for appetite change. Negative for chills, fatigue and unexpected weight change.  HENT:  Negative for congestion, rhinorrhea, sneezing and sore throat.   Eyes:  Negative for redness.  Respiratory: Negative.  Negative for cough, chest tightness, shortness of breath and wheezing.   Cardiovascular: Negative.  Negative for chest pain and palpitations.  Gastrointestinal:  Positive  for abdominal distention, abdominal pain, constipation and diarrhea. Negative for nausea and vomiting.  Genitourinary:  Negative for dysuria and frequency.  Musculoskeletal:  Negative for arthralgias, back pain, joint swelling and neck pain.  Skin:  Negative for rash.  Neurological: Negative.  Negative for tremors and numbness.  Hematological:  Negative for adenopathy. Does not bruise/bleed easily.  Psychiatric/Behavioral:  Negative for behavioral problems (Depression), sleep disturbance and suicidal ideas. The patient is not nervous/anxious.    Vital Signs: BP 122/82   Pulse 82   Temp 98 F (36.7 C)   Resp 16   Ht 5\' 8"  (1.727 m)   Wt 259 lb 12.8 oz (117.8 kg)   SpO2 98%   BMI 39.50 kg/m    Physical Exam Vitals reviewed.  Constitutional:      General: She is not in acute distress.    Appearance: Normal appearance. She is obese. She is not ill-appearing.  HENT:     Head: Normocephalic and atraumatic.  Eyes:     Extraocular Movements: Extraocular movements intact.     Pupils: Pupils are equal, round, and reactive to light.  Cardiovascular:     Rate and Rhythm: Normal rate and regular rhythm.  Pulmonary:     Effort: Pulmonary effort is normal. No respiratory distress.  Neurological:     Mental Status: She is alert and oriented to person, place, and time.     Cranial Nerves: No cranial nerve deficit.     Coordination: Coordination normal.     Gait: Gait normal.  Psychiatric:        Mood and Affect: Mood normal.        Behavior: Behavior normal.     Assessment/Plan: 1. At risk for obstructive sleep apnea Sleep stuy ordered based on patient's presenting symptoms and risk factors.  - PSG Sleep Study; Future  2. Snoring Sleep study ordered, see problem #1 - PSG Sleep Study; Future  3. Right upper quadrant abdominal pain Ultrasound ordered. GI referral sent in and labs  - Ambulatory referral to Gastroenterology - Celiac Disease Panel - Food Allergy Profile -  Abdomen Limited RUQ (LIVER/GB); Future  4. Alternating constipation and diarrhea GI referral, labs and ultrasound ordered - Ambulatory referral to Gastroenterology - Celiac Disease Panel - Food Allergy Profile - US Abdomen Limited RUQ (LIVER/GB); Future  5. Acne vulgaris Takes doxy for acne, refill ordered - doxycycline (ADOXA) 150 MG tablet; Take 1 tablet (150 mg total) by mouth daily.  Dispense: 30 tablet; Refill: 2  6. Class 2 severe obesity due to excess calories with serious comorbidity and body mass index (BMI) of 39.0 to 39.9 in adult [facility]) Obesity increases the risk of OSA and other respiratory disorders - PSG Sleep Study; Future   General Counseling: Birgit verbalizes understanding of the findings of todays visit and agrees with plan of treatment. I have discussed any further diagnostic evaluation that  may be needed or ordered today. We also reviewed her medications today. she has been encouraged to call the office with any questions or concerns that should arise related to todays visit.    Orders Placed This Encounter  Procedures   US Abdomen Limited RUQ (LIVER/GB)   Celiac Disease Panel   Food Allergy Profile   Ambulatory referral to Gastroenterology   PSG Sleep Study    Meds ordered this encounter  Medications   doxycycline (ADOXA) 150 MG tablet    Sig: Take 1 tablet (150 mg total) by mouth daily.    Dispense:  30 tablet    Refill:  2    Return in about 4 weeks (around 03/25/2021) for F/U after sleep study to discuss results, U/S @ [facility] PCP.   Total time spent:30 Minutes Time spent includes review of chart, medications, test results, and follow up plan with the patient.   [place] Controlled Substance Database was reviewed by me.  This patient was seen by Sallyanne Kuster, FNP-C in collaboration with Dr. Beverely Risen as a part of collaborative care agreement.   Zivah Mayr R. Tedd Sias, MSN, FNP-C Internal medicine

## 2021-02-28 ENCOUNTER — Telehealth: Payer: Self-pay

## 2021-02-28 NOTE — Telephone Encounter (Signed)
Pt does not want to sch covid vaccine at this time

## 2021-03-09 ENCOUNTER — Other Ambulatory Visit: Payer: BC Managed Care – PPO

## 2021-03-13 LAB — FOOD ALLERGY PROFILE
Allergen Corn, IgE: <0.1 kU/L
Clam IgE: <0.1 kU/L
Codfish IgE: <0.1 kU/L
Egg White IgE: <0.1 kU/L
Milk IgE: <0.1 kU/L
Peanut IgE: <0.1 kU/L
Scallop IgE: <0.1 kU/L
Sesame Seed IgE: <0.1 kU/L
Shrimp IgE: 0.89 kU/L — AB
Soybean IgE: <0.1 kU/L
Walnut IgE: <0.1 kU/L
Wheat IgE: <0.1 kU/L

## 2021-03-13 LAB — CELIAC DISEASE PANEL
Endomysial IgA: NEGATIVE
IgA/Immunoglobulin A, Serum: 121 mg/dL (ref 87–352)
Transglutaminase IgA: <2 U/mL (ref 0–3)

## 2021-03-14 NOTE — Progress Notes (Signed)
LMOM need to give results of allergy test

## 2021-03-14 NOTE — Progress Notes (Signed)
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

## 2021-03-15 ENCOUNTER — Encounter: Payer: Self-pay | Admitting: Obstetrics and Gynecology

## 2021-03-15 ENCOUNTER — Other Ambulatory Visit (HOSPITAL_COMMUNITY)
Admission: RE | Admit: 2021-03-15 | Discharge: 2021-03-15 | Disposition: A | Discharge status: Home / Self Care | Payer: BC Managed Care – PPO | Source: Ambulatory Visit | Attending: Obstetrics and Gynecology | Admitting: Obstetrics and Gynecology

## 2021-03-15 ENCOUNTER — Ambulatory Visit (INDEPENDENT_AMBULATORY_CARE_PROVIDER_SITE_OTHER): Payer: BC Managed Care – PPO | Admitting: Obstetrics and Gynecology

## 2021-03-15 ENCOUNTER — Other Ambulatory Visit: Payer: Self-pay

## 2021-03-15 VITALS — BP 112/77 | HR 88 | Resp 16 | Ht 68.0 in | Wt 260.2 lb

## 2021-03-15 DIAGNOSIS — Z124 Encounter for screening for malignant neoplasm of cervix: Secondary | ICD-10-CM | POA: Insufficient documentation

## 2021-03-15 NOTE — Patient Instructions (Signed)
Pap Test °Why am I having this test? °A Pap test, also called a Pap smear, is a screening test to check for signs of: °Infection. °Cancer of the cervix. The cervix is the lower part of the uterus that opens into the vagina. °Changes that may be a sign that cancer is developing (precancerous changes). °Women need this test on a regular basis. In general, you should have a Pap test every 3 years until you reach menopause or age 21. Women aged 30-60 may choose to have their Pap test done at the same time as an HPV (human papillomavirus) test every 5 years (instead of every 3 years). °Your health care provider may recommend having Pap tests more or less often depending on your medical conditions and past Pap test results. °What is being tested? °Cervical cells are tested for signs of infection or abnormalities. °What kind of sample is taken? °Your health care provider will collect a sample of cells from the surface of your cervix. This will be done using a small cotton swab, plastic spatula, or brush that is inserted into your vagina using a tool called a speculum. This sample is often collected during a pelvic exam, when you are lying on your back on an exam table with your feet in footrests (stirrups). In some cases, fluids (secretions) from the cervix or vagina may also be collected. °How do I prepare for this test? °Be aware of where you are in your menstrual cycle. If you are menstruating on the day of the test, you may be asked to reschedule. °You may need to reschedule if you have a known vaginal infection on the day of the test. °Follow instructions from your health care provider about: °Changing or stopping your regular medicines. Some medicines can cause abnormal test results, such as vaginal medicines and tetracycline. °Avoiding douching 2-3 days before or the day of the test. °Tell a health care provider about: °Any allergies you have. °All medicines you are taking, including vitamins, herbs, eye drops,  creams, and over-the-counter medicines. °Any bleeding problems you have. °Any surgeries you have had. °Any medical conditions you have. °Whether you are pregnant or may be pregnant. °How are the results reported? °Your test results will be reported as either abnormal or normal. °What do the results mean? °A normal test result means that you do not have signs of cancer of the cervix. °An abnormal result may mean that you have: °Cancer. A Pap test by itself is not enough to diagnose cancer. You will have more tests done if cancer is suspected. °Precancerous changes in your cervix. °Inflammation of the cervix. °An STI (sexually transmitted infection). °A fungal infection. °A parasite infection. °Talk with your health care provider about what your results mean. In some cases, your health care provider may do more testing to confirm the results. °Questions to ask your health care provider °Ask your health care provider, or the department that is doing the test: °When will my results be ready? °How will I get my results? °What are my treatment options? °What other tests do I need? °What are my next steps? °Summary °In general, women should have a Pap test every 3 years until they reach menopause or age 21. °Your health care provider will collect a sample of cells from the surface of your cervix. This will be done using a small cotton swab, plastic spatula, or brush. °In some cases, fluids (secretions) from the cervix or vagina may also be collected. °This information is not intended   to replace advice given to you by your health care provider. Make sure you discuss any questions you have with your health care provider. °Document Revised: 08/13/2020 Document Reviewed: 08/13/2020 °Elsevier Patient Education © 2022 [employer] ° °

## 2021-03-15 NOTE — Progress Notes (Signed)
  Subjective:     Dominique Morris is a 20 y.o. woman who comes in today for a  pap smear only. Her most recent annual exam was on 08/25/2020. She has never had a pap smear. Previous abnormal Pap smears: no. Contraception: OCP (estrogen/progesterone)  The following portions of the patient's history were reviewed and updated as appropriate: allergies, current medications, past family history, past medical history, past social history, past surgical history, and problem list.  Review of Systems A comprehensive review of systems was negative.   Objective:    BP 112/77   Pulse 88   Resp 16   Ht 5\' 8"  (1.727 m)   Wt 260 lb 3.2 oz (118 kg)   LMP 03/07/2021 (Exact Date)   BMI 39.56 kg/m  Pelvic Exam: external genitalia normal, rectovaginal septum normal.  Vagina without discharge.  Cervix normal appearing, no lesions and no motion tenderness.  Uterus mobile, nontender, normal shape and size.  Adnexae non-palpable, nontender bilaterally. . Pap smear obtained.   Assessment:    Screening pap smear.   Plan:    Follow up in 5 months for annual exam, or as indicated by Pap results.

## 2021-03-16 ENCOUNTER — Ambulatory Visit: Payer: BC Managed Care – PPO

## 2021-03-16 ENCOUNTER — Telehealth: Payer: Self-pay

## 2021-03-16 DIAGNOSIS — R1011 Right upper quadrant pain: Secondary | ICD-10-CM

## 2021-03-16 DIAGNOSIS — R198 Other specified symptoms and signs involving the digestive system and abdomen: Secondary | ICD-10-CM

## 2021-03-16 LAB — CYTOLOGY - PAP
Chlamydia: NEGATIVE
Comment: NEGATIVE
Comment: NORMAL
Diagnosis: NEGATIVE
Neisseria Gonorrhea: NEGATIVE

## 2021-03-16 NOTE — Telephone Encounter (Signed)
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

## 2021-03-16 NOTE — Telephone Encounter (Signed)
Spoke with pt, informed her of results and moderate shrimp allergy

## 2021-03-22 ENCOUNTER — Ambulatory Visit: Payer: BC Managed Care – PPO | Admitting: Gastroenterology

## 2021-03-22 ENCOUNTER — Encounter: Payer: Self-pay | Admitting: Gastroenterology

## 2021-03-22 ENCOUNTER — Other Ambulatory Visit: Payer: Self-pay

## 2021-03-22 VITALS — BP 128/80 | HR 84 | Temp 98.4°F | Ht 69.0 in | Wt 258.6 lb

## 2021-03-22 DIAGNOSIS — K581 Irritable bowel syndrome with constipation: Secondary | ICD-10-CM | POA: Diagnosis not present

## 2021-03-22 NOTE — Progress Notes (Signed)
Dominique Mood MD, MRCP(U.K) [address]  [address]  [place], [place] [address]  Main: [phone]  Fax: [phone]   Gastroenterology Consultation  Referring Provider:     Sallyanne Kuster, NP Primary Care Physician:  Sallyanne Kuster, NP Primary Gastroenterologist:  Dr. Wyline Morris  Reason for Consultation:     Abdominal pain , bloating and constipation        HPI:   Dominique Morris is a 20 y.o. y/o female referred for consultation & management  by  Sallyanne Kuster, NP.    03/16/2021: RUQ USG: normal   03/07/2021: Food allergy profile: Elevated Ige to shrimp, celiac serology negative. TSH: normal   No recent CBC,CMP  She states that about a month back she has had an episode of stomach discomfort followed by 1 episode of diarrhea.  After that episode started to have issues with constipation with not having a bowel movement for a few days and when she did have 1 would be very hard.  She does consume diet which has some fiber in it but not too much.  No family history of colon cancer or polyps.  She has been treated with thyroid supplementation for Hashimoto's disease.  She also has been commenced on Ozempic for PCOS and has lost some weight since then.  Denies any rectal bleeding.  Has not tried any over-the-counter medications for constipation.  No other complaints.  She denies any pain whatsoever at this point of time.  The only time she had some lower abdominal pain was when she had an episode of diarrhea about a month back.  Past Medical History:  Diagnosis Date   Dysmenorrhea    PCOS (polycystic ovarian syndrome)    Thyroid disease     Past Surgical History:  Procedure Laterality Date   OSTEOCHONDROMA EXCISION  02/2014   WISDOM TOOTH EXTRACTION  08/15/2019    Prior to Admission medications   Medication Sig Start Date End Date Taking? Authorizing Provider  doxycycline (ADOXA) 150 MG tablet Take 1 tablet (150 mg total) by mouth daily. 02/25/21   Sallyanne Kuster, NP  ibuprofen (ADVIL) 800 MG tablet Take 1 tablet (800 mg total) by mouth every 8 (eight) hours as needed for cramping. 11/07/19   Hildred Laser, MD  levothyroxine (SYNTHROID) 137 MCG tablet Take 137 mcg by mouth daily before breakfast.    [provider]  liothyronine (CYTOMEL) 5 MCG tablet Take by mouth. 10/13/20 10/13/21  [provider]  norgestimate-ethinyl estradiol (ORTHO-CYCLEN) 0.25-35 MG-MCG tablet Take 1 tablet by mouth daily. 10/14/20   Hildred Laser, MD  Semaglutide, 2 MG/DOSE, (OZEMPIC, 2 MG/DOSE,) 8 MG/3ML SOPN Inject into the skin. 01/20/21   [provider]    Family History  Problem Relation Age of Onset   Endometriosis Mother    Hyperlipidemia Father    Endometriosis Maternal Aunt    Breast cancer Neg Hx    Ovarian cancer Neg Hx    Colon cancer Neg Hx      Social History   Tobacco Use   Smoking status: Never   Smokeless tobacco: Never  Vaping Use   Vaping Use: Never used  Substance Use Topics   Alcohol use: No   Drug use: No    Allergies as of 03/22/2021 - Review Complete 03/22/2021  Allergen Reaction Noted   Latex      Review of Systems:    All systems reviewed and negative except where noted in HPI.   Physical Exam:  BP  128/80   Pulse 84   Temp 98.4 F (36.9 C) (Oral)   Ht 5\' 9"  (1.753 m)   Wt 258 lb 9.6 oz (117.3 kg)   LMP 03/07/2021 (Exact Date)   BMI 38.19 kg/m  Patient's last menstrual period was 03/07/2021 (exact date). Psych:  Alert and cooperative. Normal Morris and affect. General:   Alert,  Well-developed, well-nourished, pleasant and cooperative in NAD Head:  Normocephalic and atraumatic. Eyes:  Sclera clear, no icterus.   Conjunctiva pink. Ears:  Normal auditory acuity. Abdomen:  Normal bowel sounds.  No bruits.  Soft, non-tender and non-distended without masses, hepatosplenomegaly or hernias noted.  No guarding or rebound tenderness.    Neurologic:  Alert and oriented x3;  grossly normal  neurologically. Psych:  Alert and cooperative. Normal Morris and affect.  Imaging Studies: No results found.  Assessment and Plan:   JAYDN FINCHER is a 20 y.o. y/o female has been referred for RUQ pain, constipation .  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  Explained to her that usually takes a few months for her to return back to normal.  Suggest a high-fiber diet with at least 25 g of fiber per day.  Provided patient information.  I will give her samples of Linzess 290 mcg tablets to be taken daily for 1 week.  If it works she will call my office and get a prescription if it does not work she will call my office and we will try her on a different option.  If no better at follow-up visit we will consider colonoscopy.  It is possible that her Hashimoto's disease may have played a role with the change in bowel habits from diarrhea to constipation.  Follow up in 4 weeks approximately  Dr 36 MD,MRCP(U.K)

## 2021-03-22 NOTE — Patient Instructions (Signed)
High-Fiber Eating Plan °Fiber, also called dietary fiber, is a type of carbohydrate. It is found foods such as fruits, vegetables, whole grains, and beans. A high-fiber diet can have many health benefits. Your health care provider may recommend a high-fiber diet to help: °Prevent constipation. Fiber can make your bowel movements more regular. °Lower your cholesterol. °Relieve the following conditions: °Inflammation of veins in the anus (hemorrhoids). °Inflammation of specific areas of the digestive tract (uncomplicated diverticulosis). °A problem of the large intestine, also called the colon, that sometimes causes pain and diarrhea (irritable bowel syndrome, or IBS). °Prevent overeating as part of a weight-loss plan. °Prevent heart disease, type 2 diabetes, and certain cancers. °What are tips for following this plan? °Reading food labels ° °Check the nutrition facts label on food products for the amount of dietary fiber. Choose foods that have 5 grams of fiber or more per serving. °The goals for recommended daily fiber intake include: °Men (age 50 or younger): 34-38 g. °Men (over age 50): 28-34 g. °Women (age 50 or younger): 25-28 g. °Women (over age 50): 22-25 g. °Your daily fiber goal is _____________ g. °Shopping °Choose whole fruits and vegetables instead of processed forms, such as apple juice or applesauce. °Choose a wide variety of high-fiber foods such as avocados, lentils, oats, and kidney beans. °Read the nutrition facts label of the foods you choose. Be aware of foods with added fiber. These foods often have high sugar and sodium amounts per serving. °Cooking °Use whole-grain flour for baking and cooking. °Cook with brown rice instead of white rice. °Meal planning °Start the day with a breakfast that is high in fiber, such as a cereal that contains 5 g of fiber or more per serving. °Eat breads and cereals that are made with whole-grain flour instead of refined flour or white flour. °Eat brown rice, bulgur  wheat, or millet instead of white rice. °Use beans in place of meat in soups, salads, and pasta dishes. °Be sure that half of the grains you eat each day are whole grains. °General information °You can get the recommended daily intake of dietary fiber by: °Eating a variety of fruits, vegetables, grains, nuts, and beans. °Taking a fiber supplement if you are not able to take in enough fiber in your diet. It is better to get fiber through food than from a supplement. °Gradually increase how much fiber you consume. If you increase your intake of dietary fiber too quickly, you may have bloating, cramping, or gas. °Drink plenty of water to help you digest fiber. °Choose high-fiber snacks, such as berries, raw vegetables, nuts, and popcorn. °What foods should I eat? °Fruits °Berries. Pears. Apples. Oranges. Avocado. Prunes and raisins. Dried figs. °Vegetables °Sweet potatoes. Spinach. Kale. Artichokes. Cabbage. Broccoli. Cauliflower. Green peas. Carrots. Squash. °Grains °Whole-grain breads. Multigrain cereal. Oats and oatmeal. Brown rice. Barley. Bulgur wheat. Millet. Quinoa. Bran muffins. Popcorn. Rye wafer crackers. °Meats and other proteins °Navy beans, kidney beans, and pinto beans. Soybeans. Split peas. Lentils. Nuts and seeds. °Dairy °Fiber-fortified yogurt. °Beverages °Fiber-fortified soy milk. Fiber-fortified orange juice. °Other foods °Fiber bars. °The items listed above may not be a complete list of recommended foods and beverages. Contact a dietitian for more information. °What foods should I avoid? °Fruits °Fruit juice. Cooked, strained fruit. °Vegetables °Fried potatoes. Canned vegetables. Well-cooked vegetables. °Grains °White bread. Pasta made with refined flour. White rice. °Meats and other proteins °Fatty cuts of meat. Fried chicken or fried fish. °Dairy °Milk. Yogurt. Cream cheese. Sour cream. °Fats and   oils °Butters. °Beverages °Soft drinks. °Other foods °Cakes and pastries. °The items listed above may  not be a complete list of foods and beverages to avoid. Talk with your dietitian about what choices are best for you. °Summary °Fiber is a type of carbohydrate. It is found in foods such as fruits, vegetables, whole grains, and beans. °A high-fiber diet has many benefits. It can help to prevent constipation, lower blood cholesterol, aid weight loss, and reduce your risk of heart disease, diabetes, and certain cancers. °Increase your intake of fiber gradually. Increasing fiber too quickly may cause cramping, bloating, and gas. Drink plenty of water while you increase the amount of fiber you consume. °The best sources of fiber include whole fruits and vegetables, whole grains, nuts, seeds, and beans. °This information is not intended to replace advice given to you by your health care provider. Make sure you discuss any questions you have with your health care provider. °Document Revised: 09/18/2019 Document Reviewed: 09/18/2019 °Elsevier Patient Education © 2022 Elsevier Inc. ° °

## 2021-03-24 ENCOUNTER — Other Ambulatory Visit: Payer: Self-pay

## 2021-03-24 ENCOUNTER — Encounter: Payer: Self-pay | Admitting: Nurse Practitioner

## 2021-03-24 ENCOUNTER — Ambulatory Visit: Payer: BC Managed Care – PPO | Admitting: Nurse Practitioner

## 2021-03-24 VITALS — BP 125/67 | HR 76 | Temp 98.6°F | Resp 16 | Ht 69.0 in | Wt 260.2 lb

## 2021-03-24 DIAGNOSIS — R1011 Right upper quadrant pain: Secondary | ICD-10-CM

## 2021-03-24 DIAGNOSIS — G479 Sleep disorder, unspecified: Secondary | ICD-10-CM

## 2021-03-24 DIAGNOSIS — E063 Autoimmune thyroiditis: Secondary | ICD-10-CM | POA: Diagnosis not present

## 2021-03-24 DIAGNOSIS — R198 Other specified symptoms and signs involving the digestive system and abdomen: Secondary | ICD-10-CM

## 2021-03-24 NOTE — Progress Notes (Signed)
[facility] [address] [place], [place] [address]  Internal MEDICINE  Office Visit Note  Patient Name: Dominique Morris  [id number]  [id number]  Date of Service: 03/24/2021  Chief Complaint  Patient presents with   Follow-up    Discuss results     HPI Dominique Morris presents for a follow up visit for ultrasound results. Her abdominal ultrasound was normal with no acute or structural abnormalities noted. She had no cholelithiasis and no obstruction. Dr. Tobi Bastos started her on linzess but she feels like this is too strong.  She had a sleep study previously ordered but still has not been scheduled for the sleep study.  She will call about doing sleep study.     Current Medication: Outpatient Encounter Medications as of 03/24/2021  Medication Sig   doxycycline (ADOXA) 150 MG tablet Take 1 tablet (150 mg total) by mouth daily.   ibuprofen (ADVIL) 800 MG tablet Take 1 tablet (800 mg total) by mouth every 8 (eight) hours as needed for cramping.   levothyroxine (SYNTHROID) 137 MCG tablet Take 137 mcg by mouth daily before breakfast.   liothyronine (CYTOMEL) 5 MCG tablet Take by mouth.   norgestimate-ethinyl estradiol (ORTHO-CYCLEN) 0.25-35 MG-MCG tablet Take 1 tablet by mouth daily.   Semaglutide, 2 MG/DOSE, (OZEMPIC, 2 MG/DOSE,) 8 MG/3ML SOPN Inject into the skin.   No facility-administered encounter medications on file as of 03/24/2021.    Surgical History: Past Surgical History:  Procedure Laterality Date   OSTEOCHONDROMA EXCISION  02/2014   WISDOM TOOTH EXTRACTION  08/15/2019    Medical History: Past Medical History:  Diagnosis Date   Dysmenorrhea    PCOS (polycystic ovarian syndrome)    Thyroid disease     Family History: Family History  Problem Relation Age of Onset   Endometriosis Mother    Cushing syndrome Mother    Hyperlipidemia Father    Endometriosis Maternal Aunt    Breast cancer Neg Hx    Ovarian cancer Neg Hx    Colon cancer Neg Hx     Social  History   Socioeconomic History   Marital status: Single    Spouse name: Not on file   Number of children: Not on file   Years of education: Not on file   Highest education level: Not on file  Occupational History   Not on file  Tobacco Use   Smoking status: Never   Smokeless tobacco: Never  Vaping Use   Vaping Use: Never used  Substance and Sexual Activity   Alcohol use: No   Drug use: No   Sexual activity: Yes    Birth control/protection: Pill, Condom  Other Topics Concern   Not on file  Social History Narrative   Not on file   Social Determinants of Health   Financial Resource Strain: Not on file  Food Insecurity: Not on file  Transportation Needs: Not on file  Physical Activity: Not on file  Stress: Not on file  Social Connections: Not on file  Intimate Partner Violence: Not on file      Review of Systems  Constitutional:  Positive for appetite change. Negative for chills, fatigue and unexpected weight change.  HENT:  Negative for congestion, rhinorrhea, sneezing and sore throat.   Eyes:  Negative for redness.  Respiratory: Negative.  Negative for cough, chest tightness, shortness of breath and wheezing.   Cardiovascular: Negative.  Negative for chest pain and palpitations.  Gastrointestinal:  Positive for abdominal distention. Negative for abdominal pain, constipation, diarrhea, nausea  and vomiting.  Genitourinary:  Negative for dysuria and frequency.  Musculoskeletal:  Negative for arthralgias, back pain, joint swelling and neck pain.  Skin:  Negative for rash.  Neurological: Negative.  Negative for tremors and numbness.  Hematological:  Negative for adenopathy. Does not bruise/bleed easily.  Psychiatric/Behavioral:  Negative for behavioral problems (Depression), sleep disturbance and suicidal ideas. The patient is not nervous/anxious.    Vital Signs: BP 125/67   Pulse 76   Temp 98.6 F (37 C)   Resp 16   Ht 5\' 9"  (1.753 m)   Wt 260 lb 3.2 oz (118 kg)    LMP 03/07/2021 (Exact Date)   SpO2 98%   BMI 38.42 kg/m    Physical Exam Vitals reviewed.  Constitutional:      General: She is not in acute distress.    Appearance: Normal appearance. She is obese. She is not ill-appearing.  HENT:     Head: Normocephalic and atraumatic.  Eyes:     Extraocular Movements: Extraocular movements intact.     Pupils: Pupils are equal, round, and reactive to light.  Cardiovascular:     Rate and Rhythm: Normal rate and regular rhythm.  Pulmonary:     Effort: Pulmonary effort is normal. No respiratory distress.  Neurological:     Mental Status: She is alert and oriented to person, place, and time.     Cranial Nerves: No cranial nerve deficit.     Coordination: Coordination normal.     Gait: Gait normal.  Psychiatric:        Mood and Affect: Mood normal.        Behavior: Behavior normal.       Assessment/Plan: 1. Sleep disturbance Sleep study has been ordered, patient will call to schedule sleep study  2. Right upper quadrant abdominal pain Ultrasound was normal, she was also referred to Dr. 05/07/2021. She is following up with him as well.   3. Alternating constipation and diarrhea She was given Linzess but is worried that it is too strong. Encouraged patient to take sennakot S (docusate and senna) 1-2 times daily and follow up with Dr. Wyline Mood.   4. Hashimoto's thyroiditis Followed by [facility] endocrinology. Her previous thyroid ultrasound was normal.   General Counseling: Dominique Morris verbalizes understanding of the findings of todays visit and agrees with plan of treatment. I have discussed any further diagnostic evaluation that may be needed or ordered today. We also reviewed her medications today. she has been encouraged to call the office with any questions or concerns that should arise related to todays visit.    No orders of the defined types were placed in this encounter.   No orders of the defined types were placed in this  encounter.   Return for F/U SS results after having sleep study, she needs to call to schedule the sleep study.   Total time spent:30 Minutes Time spent includes review of chart, medications, test results, and follow up plan with the patient.   [place] Controlled Substance Database was reviewed by me.  This patient was seen by [facility], FNP-C in collaboration with Dr. Sallyanne Kuster as a part of collaborative care agreement.   Dominique Morris R. Beverely Risen, MSN, FNP-C Internal medicine

## 2021-04-09 ENCOUNTER — Encounter (INDEPENDENT_AMBULATORY_CARE_PROVIDER_SITE_OTHER): Payer: BC Managed Care – PPO | Admitting: Internal Medicine

## 2021-04-09 DIAGNOSIS — G4719 Other hypersomnia: Secondary | ICD-10-CM

## 2021-04-15 DIAGNOSIS — G4719 Other hypersomnia: Secondary | ICD-10-CM | POA: Insufficient documentation

## 2021-04-15 NOTE — Procedures (Signed)
[facility]  Polysomnogram Report Part I                                                                 Phone: [phone] Fax: [phone]  Patient Name: Dominique Morris, Dominique Morris Acquisition Number: [id number]  Date of Birth: 11-11-2000 Acquisition Date: 04/09/2021  Referring Physician: Sallyanne Kuster, FNP-C     History: The patient is a 20 year old female who was referred for evaluation of possible sleep apnea with snoring and sleepiness. Medical History: dysmenorrhea, PCOS, thyroid disease and class 2 Obesity.  Medications: glascoterone, Advil, Synthroid, Cytomel, Elocon, Ortho-Cyclen, Ozempic, Retin-A and Adoxa.  Procedure: This routine overnight polysomnogram was performed on the 5 using the standard diagnostic protocol. This included 6 channels of EEG, 2 channels of EOG, chin EMG, bilateral anterior tibialis EMG, nasal/oral thermistor, PTAF (nasal pressure transducer), chest and abdominal wall movements, EKG, and pulse oximetry.  Description: The total recording time was 404.2 minutes. The total sleep time was 381.0 minutes. There were a total of 16.4 minutes of wakefulness after sleep onset for a goodsleep efficiency of 94.3%. The latency to sleep onset was shortat 6.8 minutes. The R sleep onset latency was within normal limits at 114.0 minutes. Sleep parameters, as a percentage of the total sleep time, demonstrated 21.8% of sleep was in N1 sleep, 57.5% N2, 8.1% N3 and 12.6% R sleep. There were a total of 86 arousals for an arousal index of 13.5 arousals per hour of sleep that was slightly elevated.  Respiratory monitoring demonstrated rare no significant snoring on the back. Only 3 respiratory events were observed the entire study. The baseline oxygen saturation during wakefulness was 98%, during NREM sleep averaged 96%, and during REM sleep averaged  96%. The total duration of oxygen < 90% was 0.0 minutes.  Cardiac monitoring- There were no significant cardiac rhythm  irregularities.   Periodic limb movement monitoring- demonstrated that there were 27 periodic limb movements for a periodic limb movement index of 4.3 periodic limb movements per hour of sleep.   Impression: This routine overnight polysomnogram did not demonstrate significant obstructive sleep apnea due to a low Apnea Hypopnea Index of 0.5 apneas and hypopneas per hour.   There were few periodic limb movements that commonly are not significant. Clinical correlation would be suggested.   There was a slightly elevated arousal index with reduced percentages of REM and slow wave sleep.  Recommendations:     Would recommend weight loss in a patient with a BMI of 39.5.     Yevonne Pax, MD, [facility] Diplomate ABMS-Pulmonary, Critical Care and Sleep Medicine  Electronically reviewed and digitally signed   [facility] Polysomnogram Report Part II  Phone: [phone] Fax: [phone]  Patient last name Morris Neck Size 14.5 in. Acquisition [id number]  Patient first name Dominique Weight 260.0 lbs. Started 04/09/2021 at 11:45:10 PM  Birth date [birth year] Height 68.0 in. Stopped 04/10/2021 at 6:43:40 AM  Age 62 BMI 39.5 lb/in2 Duration 404.2  Study Type Adult      Report generated by Ramon Dredge., RPSGT Sleep Data: Lights Out: 11:50:22 PM Sleep Onset: 11:57:10 PM  Lights On: 6:34:34 AM Sleep Efficiency: 94.3 %  Total Recording Time: 404.2 min Sleep Latency (from Lights Off) 6.8 min  Total Sleep Time (TST): 381.0 min R Latency (from Sleep Onset): 114.0 min  Sleep Period Time: 397.0 min Total number of awakenings: 15  Wake during sleep: 16.0 min Wake After Sleep Onset (WASO): 16.4 min   Sleep Data:         Arousal Summary: Stage  Latency from lights out (min) Latency from sleep onset (min) Duration (min) % Total Sleep Time  Normal values  N 1 6.8 0.0 83.0 21.8 (5%)  N 2 18.8 12.0 219.0 57.5 (50%)  N 3 48.3 41.5 31.0 8.1 (20%)  R 120.8 114.0 48.0 12.6 (25%)    Number Index   Spontaneous 40 6.3  Apneas & Hypopneas 1 0.2  RERAs 0 0.0       (Apneas & Hypopneas & RERAs)  (1) (0.2)  Limb Movement 45 7.1  Snore 0 0.0  TOTAL 86 13.5     Respiratory Data:  CA OA MA Apnea Hypopnea* A+ H RERA Total  Number 1 0 0 1 2 3  0 3  Mean Dur (sec) 11.0 0.0 0.0 11.0 16.3 14.5 0.0 14.5  Max Dur (sec) 11.0 0.0 0.0 11.0 16.5 16.5 0.0 16.5  Total Dur (min) 0.2 0.0 0.0 0.2 0.5 0.7 0.0 0.7  % of TST 0.0 0.0 0.0 0.0 0.1 0.2 0.0 0.2  Index (#/h TST) 0.2 0.0 0.0 0.2 0.3 0.5 0.0 0.5  *Hypopneas scored based on 4% or greater desaturation.  Sleep Stage:        REM NREM TST  AHI 1.3 0.4 0.5  RDI 1.3 0.4 0.5           Body Position Data:  Sleep (min) TST (%) REM (min) NREM (min) CA (#) OA (#) MA (#) HYP (#) AHI (#/h) RERA (#) RDI (#/h) Desat (#)  Supine 125.3 32.89 23.5 101.8 0 0 0 0 0.0 0 0.00 1  Non-Supine 255.70 67.11 24.50 231.20 1.00 0.00 0.00 2.00 0.70 0 0.70 8.00  Right: 255.7 67.11 24.5 231.2 1 0 0 2 0.7 0 0.7 8     Snoring: Total number of snoring episodes  0  Total time with snoring    min (   % of sleep)   Oximetry Distribution:             WK REM NREM TOTAL  Average (%)   98 96 96 96  < 90% 0.0 0.0 0.0 0.0  < 80% 0.0 0.0 0.0 0.0  < 70% 0.0 0.0 0.0 0.0  # of Desaturations* 4 1 4 9   Desat Index (#/hour) 10.5 1.3 0.7 1.4  Desat Max (%) 10 4 5 10   Desat Max Dur (sec) 78.0 11.0 77.0 78.0  Approx Min O2 during sleep 93  Approx min O2 during a respiratory event 94  Was Oxygen added (Y/N) and final rate No:   0 LPM  *Desaturations based on 3% or greater drop from baseline.   Cheyne Stokes Breathing: None Present  Heart Rate Summary:  Average Heart Rate During Sleep 77.6 bpm      Highest Heart Rate During Sleep (95th %) 87.0 bpm      Highest Heart Rate During Sleep 194 bpm (artifact)  Highest Heart Rate During Recording (TIB) 194 bpm (artifact)   Heart Rate Observations: Event Type # Events   Bradycardia 0 Lowest HR Scored: N/A  Sinus  Tachycardia During Sleep 0 Highest HR Scored: N/A  Narrow Complex Tachycardia 0 Highest HR Scored: N/A  Wide Complex Tachycardia 0 Highest HR Scored: N/A  Asystole 0 Longest Pause: N/A  Atrial Fibrillation 0 Duration Longest Event: N/A  Other Arrythmias  No Type:    Periodic Limb Movement Data: (Primary legs unless otherwise noted) Total # Limb Movement 89 Limb Movement Index 14.0  Total # PLMS 27 PLMS Index 4.3  Total # PLMS Arousals 10 PLMS Arousal Index 1.6  Percentage Sleep Time with PLMS 14.69min (3.9 % sleep)  Mean Duration limb movements (secs) 178.3

## 2021-04-26 ENCOUNTER — Encounter: Payer: Self-pay | Admitting: Gastroenterology

## 2021-04-26 ENCOUNTER — Other Ambulatory Visit: Payer: Self-pay

## 2021-04-26 ENCOUNTER — Ambulatory Visit (INDEPENDENT_AMBULATORY_CARE_PROVIDER_SITE_OTHER): Payer: BC Managed Care – PPO | Admitting: Gastroenterology

## 2021-04-26 VITALS — Ht 69.0 in | Wt 256.6 lb

## 2021-04-26 DIAGNOSIS — R194 Change in bowel habit: Secondary | ICD-10-CM

## 2021-04-26 DIAGNOSIS — K589 Irritable bowel syndrome without diarrhea: Secondary | ICD-10-CM | POA: Diagnosis not present

## 2021-04-26 MED ORDER — DICYCLOMINE HCL 10 MG PO CAPS
10.0000 mg | ORAL_CAPSULE | Freq: Four times a day (QID) | ORAL | 0 refills | Status: DC | PRN
Start: 2021-04-26 — End: 2021-05-24

## 2021-04-26 MED ORDER — NA SULFATE-K SULFATE-MG SULF 17.5-3.13-1.6 GM/177ML PO SOLN: 354.0000 mL | Freq: Once | ORAL | 0 refills | Status: AC

## 2021-04-26 NOTE — Progress Notes (Signed)
Jonathon Bellows MD, MRCP(U.K) [address]  [place], [place] [address]  Main: [phone]  Fax: [phone]   Primary Care Physician: Jonetta Osgood, NP  Primary Gastroenterologist:  Dr. Jonathon Bellows   Chief Complaint  Patient presents with   Abdominal Pain    HPI: Dominique Morris is a 20 y.o. female   Summary of history :  Initially referred and seen on 03/22/2021 for abdominal pain bloating and constipation.  03/16/2021: RUQ USG: normal    03/07/2021: Food allergy profile: Elevated Ige to shrimp, celiac serology negative. TSH: normal  All began a month prior to her initial visit with an episode of stomach discomfort followed by an 1 episode of diarrhea. After that episode started to have issues with constipation with not having a bowel movement for a few days and when she did have 1 would be very hard.  Diet is poor in fiber No family history of colon cancer or polyps.  She has been treated with thyroid supplementation for Hashimoto's disease.  She also has been commenced on Ozempic for PCOS and has lost some weight since then.  Denies any rectal bleeding.  She has not tried any OTC meds for constipation.  At initial visit she had no abdominal pain whatsoever.      Interval history   03/22/2021-04/26/2021  Treated her for IBS constipation with Linzess to 90 mcg/week. After her last visit she states that the constipation resolved but she is having multiple bowel movements after she eats.  Associated with cramping and relieved after the bowel movement.    Current Outpatient Medications  Medication Sig Dispense Refill   doxycycline (ADOXA) 150 MG tablet Take 1 tablet (150 mg total) by mouth daily. 30 tablet 2   ibuprofen (ADVIL) 800 MG tablet Take 1 tablet (800 mg total) by mouth every 8 (eight) hours as needed for cramping. 60 tablet 3   levothyroxine (SYNTHROID) 137 MCG tablet Take 137 mcg by mouth daily before breakfast.     liothyronine (CYTOMEL) 5 MCG  tablet Take by mouth.     norgestimate-ethinyl estradiol (ORTHO-CYCLEN) 0.25-35 MG-MCG tablet Take 1 tablet by mouth daily. 84 tablet 3   Semaglutide, 2 MG/DOSE, (OZEMPIC, 2 MG/DOSE,) 8 MG/3ML SOPN Inject into the skin.     No current facility-administered medications for this visit.    Allergies as of 04/26/2021 - Review Complete 04/26/2021  Allergen Reaction Noted   Latex     Shrimp (diagnostic)  03/24/2021    ROS:  General: Negative for anorexia, weight loss, fever, chills, fatigue, weakness. ENT: Negative for hoarseness, difficulty swallowing , nasal congestion. CV: Negative for chest pain, angina, palpitations, dyspnea on exertion, peripheral edema.  Respiratory: Negative for dyspnea at rest, dyspnea on exertion, cough, sputum, wheezing.  GI: See history of present illness. GU:  Negative for dysuria, hematuria, urinary incontinence, urinary frequency, nocturnal urination.  Endo: Negative for unusual weight change.    Physical Examination:   Ht 5\' 9"  (1.753 m)   Wt 256 lb 9.6 oz (116.4 kg)   BMI 37.89 kg/m   General: Well-nourished, well-developed in no acute distress.  Eyes: No icterus. Conjunctivae pink. Neuro: Alert and oriented x 3.  Grossly intact. Skin: Warm and dry, no jaundice.   Psych: Alert and cooperative, normal mood and affect.   Imaging Studies: No results found.  Assessment and Plan:   Dominique Morris is a 20 y.o. 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  She is very concerned about the change of bowel habits and would like to make sure there is nothing else going on a colon and hence we will proceed with a colonoscopy.  In the interim I will treat her with dicyclomine 10 mg as needed for her pain and IBgard samples which are peppermint oil.  She can hold off using any NSAIDs which she has not used  since her initial visit  I have discussed alternative options, risks & benefits,  which include, but are not limited to, bleeding, infection, perforation,respiratory complication & drug reaction.  The patient agrees with this plan & written consent will be obtained.       Dr Wyline Mood  MD,MRCP [facility]) Follow up in 3 months

## 2021-05-02 ENCOUNTER — Ambulatory Visit: Payer: BC Managed Care – PPO | Admitting: Nurse Practitioner

## 2021-05-02 ENCOUNTER — Encounter: Payer: Self-pay | Admitting: Nurse Practitioner

## 2021-05-02 ENCOUNTER — Other Ambulatory Visit: Payer: Self-pay

## 2021-05-02 VITALS — BP 134/69 | HR 76 | Temp 98.3°F | Resp 16 | Ht 68.0 in | Wt 258.0 lb

## 2021-05-02 DIAGNOSIS — G4719 Other hypersomnia: Secondary | ICD-10-CM | POA: Diagnosis not present

## 2021-05-02 DIAGNOSIS — E282 Polycystic ovarian syndrome: Secondary | ICD-10-CM

## 2021-05-02 DIAGNOSIS — R198 Other specified symptoms and signs involving the digestive system and abdomen: Secondary | ICD-10-CM

## 2021-05-02 MED ORDER — OZEMPIC (2 MG/DOSE) 8 MG/3ML ~~LOC~~ SOPN: 2.0000 mg | PEN_INJECTOR | SUBCUTANEOUS | 1 refills | Status: DC

## 2021-05-02 NOTE — Progress Notes (Signed)
[facility] [address] [place], [place] [address]  Internal MEDICINE  Office Visit Note  Patient Name: Dominique Morris  [id number]  [id number]  Date of Service: 05/02/2021  Chief Complaint  Patient presents with   Follow-up   Results    HPI Dominique Morris presents for follow up visit to discuss her sleep study results. Her sleep study did not show any significant sleep apnea. She did have some periodic limb movements but she reports that this does not bother her. Weight loss is recommended by Dr. Freda Morris due to elevated BMI 39.5 and increased risk for other co-morbid conditions. She reports that her sleep is ok. She sleeps according to a second shift workers schedule and she does try to keep an atmosphere conducive to sleep with black-out curtains in her bedroom. She takes ozempic for PCOS. Having trouble getting her Dominique Morris provider to fill her ozempic. Asking for refills.       Current Medication: Outpatient Encounter Medications as of 05/02/2021  Medication Sig   dicyclomine (BENTYL) 10 MG capsule Take 1 capsule (10 mg total) by mouth 4 (four) times daily as needed for spasms.   doxycycline (ADOXA) 150 MG tablet Take 1 tablet (150 mg total) by mouth daily.   ibuprofen (ADVIL) 800 MG tablet Take 1 tablet (800 mg total) by mouth every 8 (eight) hours as needed for cramping.   levothyroxine (SYNTHROID) 137 MCG tablet Take 137 mcg by mouth daily before breakfast.   liothyronine (CYTOMEL) 5 MCG tablet Take by mouth.   norgestimate-ethinyl estradiol (ORTHO-CYCLEN) 0.25-35 MG-MCG tablet Take 1 tablet by mouth daily.   [DISCONTINUED] Semaglutide, 2 MG/DOSE, (OZEMPIC, 2 MG/DOSE,) 8 MG/3ML SOPN Inject into the skin.   Semaglutide, 2 MG/DOSE, (OZEMPIC, 2 MG/DOSE,) 8 MG/3ML SOPN Inject 2 mg into the skin once a week.   No facility-administered encounter medications on file as of 05/02/2021.    Surgical History: Past Surgical History:  Procedure Laterality Date   OSTEOCHONDROMA  EXCISION  02/2014   WISDOM TOOTH EXTRACTION  08/15/2019    Medical History: Past Medical History:  Diagnosis Date   Dysmenorrhea    PCOS (polycystic ovarian syndrome)    Thyroid disease     Family History: Family History  Problem Relation Age of Onset   Endometriosis Mother    Cushing syndrome Mother    Hyperlipidemia Father    Endometriosis Maternal Aunt    Breast cancer Neg Hx    Ovarian cancer Neg Hx    Colon cancer Neg Hx     Social History   Socioeconomic History   Marital status: Single    Spouse name: Not on file   Number of children: Not on file   Years of education: Not on file   Highest education level: Not on file  Occupational History   Not on file  Tobacco Use   Smoking status: Never   Smokeless tobacco: Never  Vaping Use   Vaping Use: Never used  Substance and Sexual Activity   Alcohol use: Yes    Comment: socially   Drug use: No   Sexual activity: Yes    Birth control/protection: Pill, Condom  Other Topics Concern   Not on file  Social History Narrative   Not on file   Social Determinants of Health   Financial Resource Strain: Not on file  Food Insecurity: Not on file  Transportation Needs: Not on file  Physical Activity: Not on file  Stress: Not on file  Social Connections: Not on  file  Intimate Partner Violence: Not on file      Review of Systems  Constitutional:  Negative for chills, fatigue and unexpected weight change.  HENT:  Negative for congestion, rhinorrhea, sneezing and sore throat.   Eyes:  Negative for redness.  Respiratory:  Negative for cough, chest tightness and shortness of breath.   Cardiovascular:  Negative for chest pain and palpitations.  Gastrointestinal:  Negative for abdominal pain, constipation, diarrhea, nausea and vomiting.  Genitourinary:  Negative for dysuria and frequency.  Musculoskeletal:  Negative for arthralgias, back pain, joint swelling and neck pain.  Skin:  Negative for rash.  Neurological:  Negative.  Negative for tremors and numbness.  Hematological:  Negative for adenopathy. Does not bruise/bleed easily.  Psychiatric/Behavioral:  Negative for behavioral problems (Depression), sleep disturbance and suicidal ideas. The patient is not nervous/anxious.    Vital Signs: BP 134/69   Pulse 76   Temp 98.3 F (36.8 C)   Resp 16   Ht 5\' 8"  (1.727 m)   Wt 258 lb (117 kg)   SpO2 98%   BMI 39.23 kg/m    Physical Exam Vitals reviewed.  Constitutional:      General: She is not in acute distress.    Appearance: Normal appearance. She is obese. She is not ill-appearing.  HENT:     Head: Normocephalic and atraumatic.  Eyes:     Pupils: Pupils are equal, round, and reactive to light.  Cardiovascular:     Rate and Rhythm: Normal rate and regular rhythm.  Pulmonary:     Effort: Pulmonary effort is normal. No respiratory distress.  Neurological:     Mental Status: She is alert and oriented to person, place, and time.     Cranial Nerves: No cranial nerve deficit.     Coordination: Coordination normal.     Gait: Gait normal.  Psychiatric:        Mood and Affect: Mood normal.        Behavior: Behavior normal.       Assessment/Plan: 1. PCOS (polycystic ovarian syndrome) Taking ozempic, refill ordered as requested. Follow up with Dominique Morris endocrinology.  - Semaglutide, 2 MG/DOSE, (OZEMPIC, 2 MG/DOSE,) 8 MG/3ML SOPN; Inject 2 mg into the skin once a week.  Dispense: 3 mL; Refill: 1  2. Other hypersomnia Discussed sleep study results. No interventions. No sleep apnea, some periodic limb movements. Need to lose weight, patient is aware.   3. Alternating constipation and diarrhea Seeing Dr. , has colonoscopy scheduled.    General Counseling: Dominique Morris verbalizes understanding of the findings of todays visit and agrees with plan of treatment. I have discussed any further diagnostic evaluation that may be needed or ordered today. We also reviewed her medications today. she has been  encouraged to call the office with any questions or concerns that should arise related to todays visit.    No orders of the defined types were placed in this encounter.   Meds ordered this encounter  Medications   Semaglutide, 2 MG/DOSE, (OZEMPIC, 2 MG/DOSE,) 8 MG/3ML SOPN    Sig: Inject 2 mg into the skin once a week.    Dispense:  3 mL    Refill:  1    Return for previously scheduled, Tylek Boney PCP.   Total time spent:30 Minutes Time spent includes review of chart, medications, test results, and follow up plan with the patient.   [place] Controlled Substance Database was reviewed by me.  This patient was seen by Tobi Bastos, FNP-C in collaboration  with Dr. Clayborn Bigness as a part of collaborative care agreement.   Ayline Dingus R. Valetta Fuller, MSN, FNP-C Internal medicine

## 2021-05-24 ENCOUNTER — Other Ambulatory Visit: Payer: Self-pay | Admitting: Gastroenterology

## 2021-05-24 NOTE — Telephone Encounter (Signed)
Last filled 04/26/2021... please advise if okay to refill

## 2021-06-06 ENCOUNTER — Ambulatory Visit
Admission: RE | Admit: 2021-06-06 | Payer: BC Managed Care – PPO | Source: Ambulatory Visit | Admitting: Gastroenterology

## 2021-06-06 ENCOUNTER — Encounter: Admission: RE | Payer: Self-pay | Source: Ambulatory Visit

## 2021-06-06 ENCOUNTER — Encounter: Payer: Self-pay | Admitting: Gastroenterology

## 2021-06-06 SURGERY — COLONOSCOPY WITH PROPOFOL
Anesthesia: General

## 2021-07-26 ENCOUNTER — Ambulatory Visit: Payer: BC Managed Care – PPO | Admitting: Gastroenterology

## 2021-07-26 ENCOUNTER — Other Ambulatory Visit: Payer: Self-pay

## 2021-07-26 NOTE — Progress Notes (Unsigned)
Jonathon Bellows MD, MRCP(U.K) [address]  [place]  [place], [place] [address]  Main: [phone]  Fax: [phone]   Primary Care Physician: Jonetta Osgood, NP  Primary Gastroenterologist:  Dr. Jonathon Bellows   No chief complaint on file.   HPI: Dominique Morris is a 21 y.o. female  Summary of history :   Initially referred and seen on 03/22/2021 for abdominal pain bloating and constipation.   03/16/2021: RUQ USG: normal    03/07/2021: Food allergy profile: Elevated Ige to shrimp, celiac serology negative. TSH: normal    All began a month prior to her initial visit with an episode of stomach discomfort followed by an 1 episode of diarrhea. After that episode started to have issues with constipation with not having a bowel movement for a few days and when she did have 1 would be very hard.  Diet is poor in fiber No family history of colon cancer or polyps.  She has been treated with thyroid supplementation for Hashimoto's disease.  She also has been commenced on Ozempic for PCOS and has lost some weight since then.  Denies any rectal bleeding.  She has not tried any OTC meds for constipation.  At initial visit she had no abdominal pain whatsoever.        Interval history   04/26/2021-07/26/2021  Treated her for IBS constipation with Linzess to 90 mcg/week. After her last visit she states that the constipation resolved but she is having multiple bowel movements after she eats.  Associated with cramping and relieved after the bowel movement.    Current Outpatient Medications  Medication Sig Dispense Refill   dicyclomine (BENTYL) 10 MG capsule TAKE ONE CAPSULE BY MOUTH FOUR TIMES A DAY AS NEEDED FOR SPASMS 120 capsule 0   doxycycline (ADOXA) 150 MG tablet Take 1 tablet (150 mg total) by mouth daily. 30 tablet 2   ibuprofen (ADVIL) 800 MG tablet Take 1 tablet (800 mg total) by mouth every 8 (eight) hours as needed for cramping. 60 tablet 3   levothyroxine (SYNTHROID) 137  MCG tablet Take 137 mcg by mouth daily before breakfast.     liothyronine (CYTOMEL) 5 MCG tablet Take by mouth.     norgestimate-ethinyl estradiol (ORTHO-CYCLEN) 0.25-35 MG-MCG tablet Take 1 tablet by mouth daily. 84 tablet 3   Semaglutide, 2 MG/DOSE, (OZEMPIC, 2 MG/DOSE,) 8 MG/3ML SOPN Inject 2 mg into the skin once a week. 3 mL 1   No current facility-administered medications for this visit.    Allergies as of 07/26/2021 - Review Complete 05/02/2021  Allergen Reaction Noted   Latex     Shrimp (diagnostic)  03/24/2021    ROS:  General: Negative for anorexia, weight loss, fever, chills, fatigue, weakness. ENT: Negative for hoarseness, difficulty swallowing , nasal congestion. CV: Negative for chest pain, angina, palpitations, dyspnea on exertion, peripheral edema.  Respiratory: Negative for dyspnea at rest, dyspnea on exertion, cough, sputum, wheezing.  GI: See history of present illness. GU:  Negative for dysuria, hematuria, urinary incontinence, urinary frequency, nocturnal urination.  Endo: Negative for unusual weight change.    Physical Examination:   There were no vitals taken for this visit.  General: Well-nourished, well-developed in no acute distress.  Eyes: No icterus. Conjunctivae pink. Mouth: Oropharyngeal mucosa moist and pink , no lesions erythema or exudate. Lungs: Clear to auscultation bilaterally. Non-labored. Heart: Regular rate and rhythm, no murmurs rubs or gallops.  Abdomen: Bowel sounds are normal, nontender, nondistended, no hepatosplenomegaly or masses, no  abdominal bruits or hernia , no rebound or guarding.   Extremities: No lower extremity edema. No clubbing or deformities. Neuro: Alert and oriented x 3.  Grossly intact. Skin: Warm and dry, no jaundice.   Psych: Alert and cooperative, normal mood and affect.   Imaging Studies: No results found.  Assessment and Plan:   Dominique Morris is a 21 y.o. 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  She is very concerned about the change of bowel habits and would like to make sure there is nothing else going on a colon and hence we will proceed with a colonoscopy.  In the interim I will treat her with dicyclomine 10 mg as needed for her pain and IBgard samples which are peppermint oil.  She can hold off using any NSAIDs which she has not used since her initial visit  Dr Jonathon Bellows  MD,MRCP [facility]) Follow up in ***

## 2021-07-27 ENCOUNTER — Telehealth: Payer: Self-pay

## 2021-07-27 NOTE — Telephone Encounter (Signed)
Left vm and sent mychart message to confirm 08/01/21 appointment-Toni ?

## 2021-08-01 ENCOUNTER — Encounter: Payer: BC Managed Care – PPO | Admitting: Nurse Practitioner

## 2021-08-01 DIAGNOSIS — Z0289 Encounter for other administrative examinations: Secondary | ICD-10-CM

## 2021-08-03 ENCOUNTER — Telehealth: Payer: Self-pay

## 2021-08-03 NOTE — Telephone Encounter (Signed)
Left voicemail to reschedule missed  office visit from 08-01-21. ?

## 2021-09-23 IMAGING — US US PELVIS COMPLETE
1 series · 14 of 25 positions shown · non-contrast
Comparison: None

CLINICAL DATA: Weight gain.  Menorrhagia.

EXAM:
TRANSABDOMINAL AND TRANSVAGINAL ULTRASOUND OF PELVIS
TECHNIQUE: Both transabdominal and transvaginal ultrasound examinations of the
pelvis were performed. Transabdominal technique was performed for
global imaging of the pelvis including uterus, ovaries, adnexal
regions, and pelvic cul-de-sac. It was necessary to proceed with
endovaginal exam following the transabdominal exam to visualize the
endometrium and ovaries.

[Series 1: us pelvis (transabdominal only) · 14 of 125 slices shown]
[im 1/125]
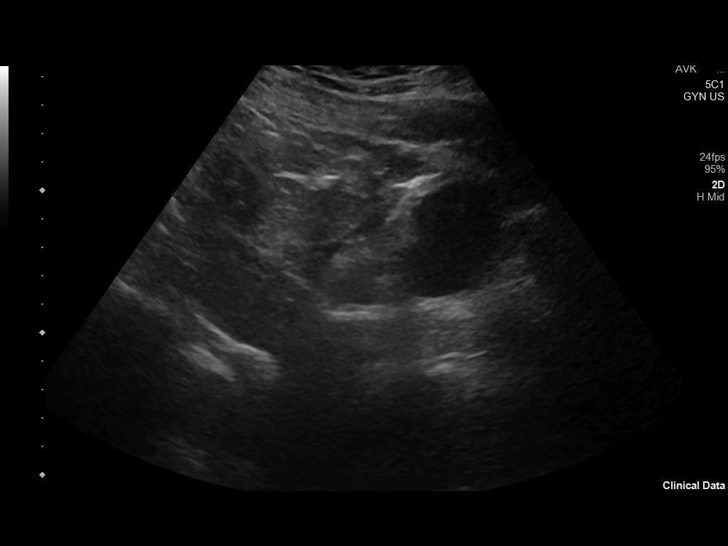
[im 11/125]
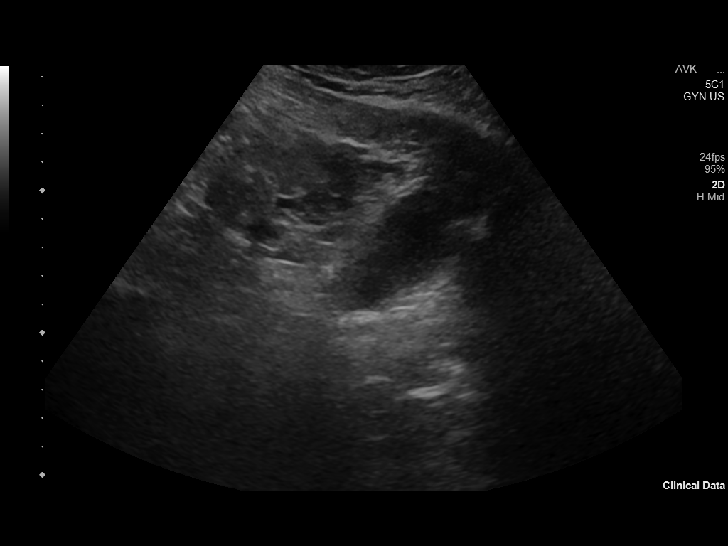
[im 21/125]
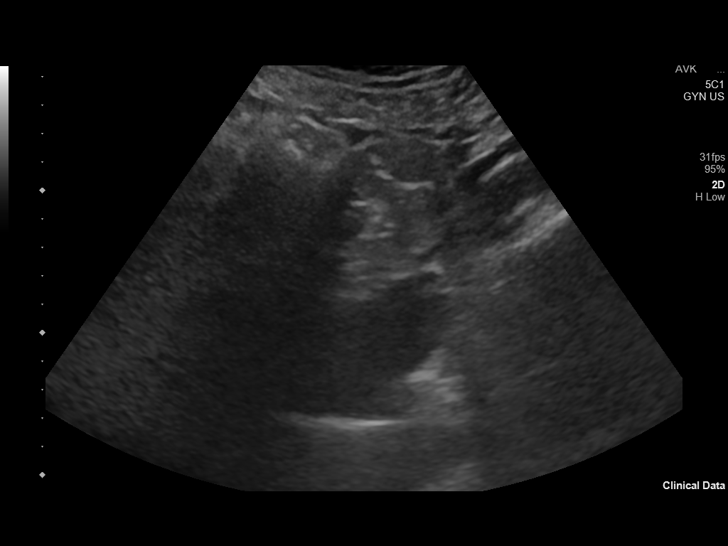
[im 32/125]
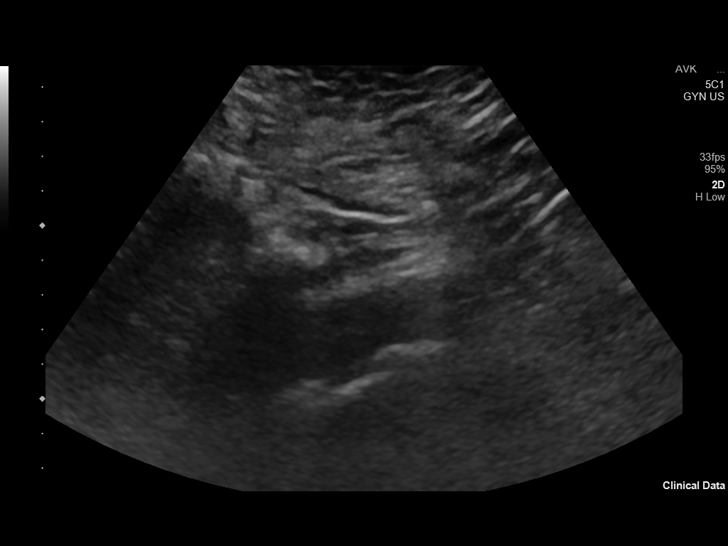
[im 42/125]
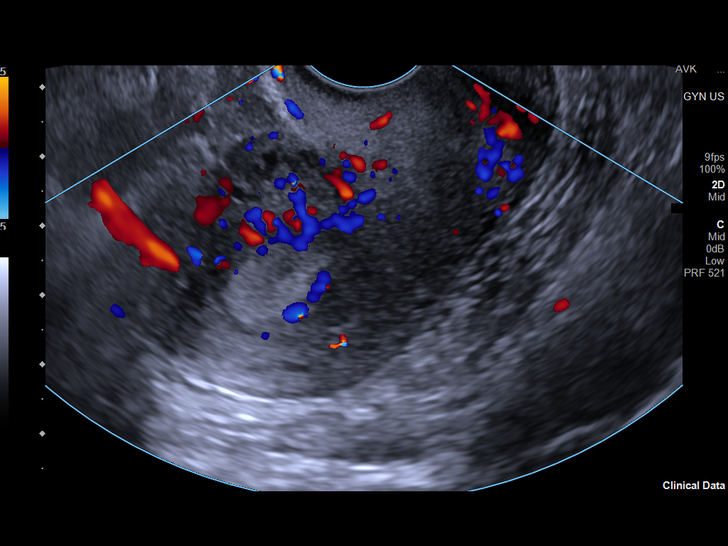
[im 47/125]
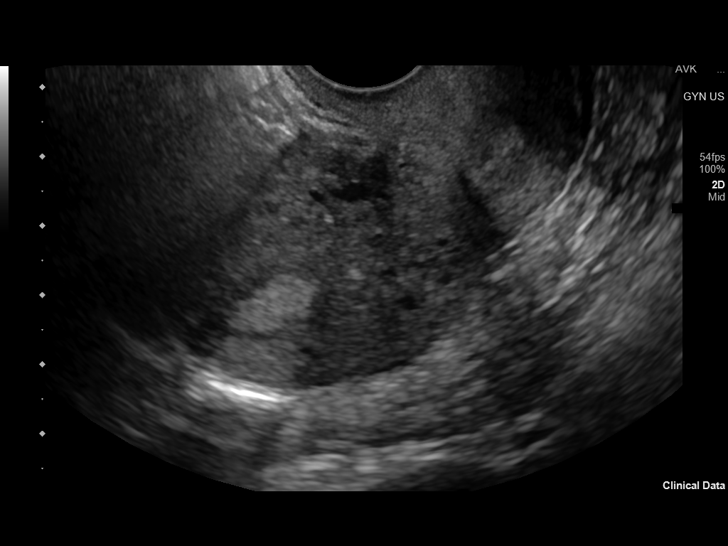
[im 57/125]
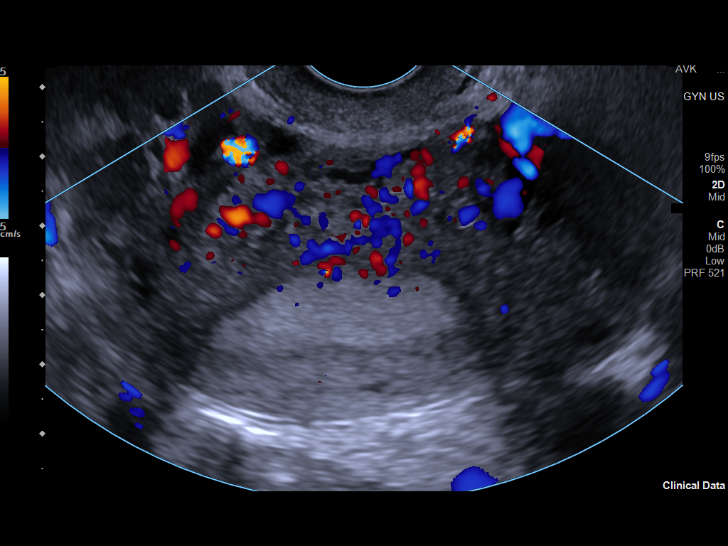
[im 68/125]
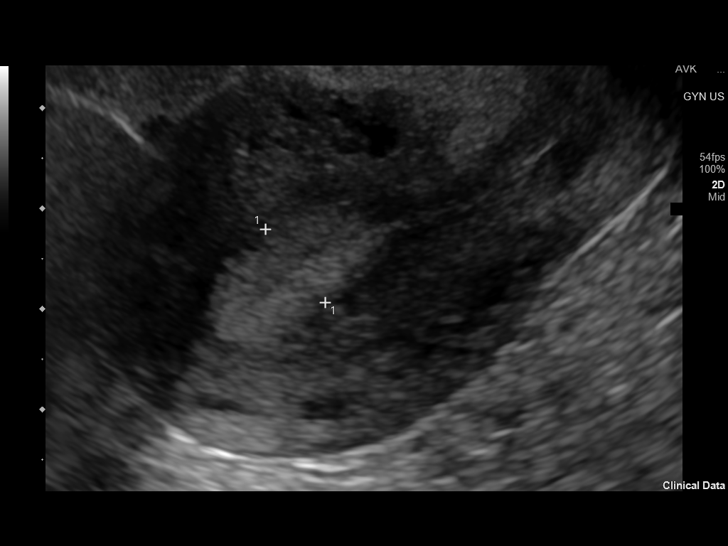
[im 78/125]
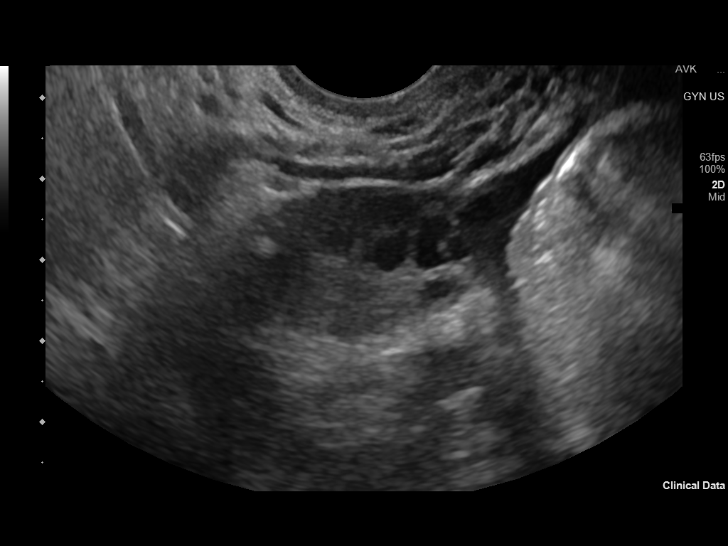
[im 83/125]
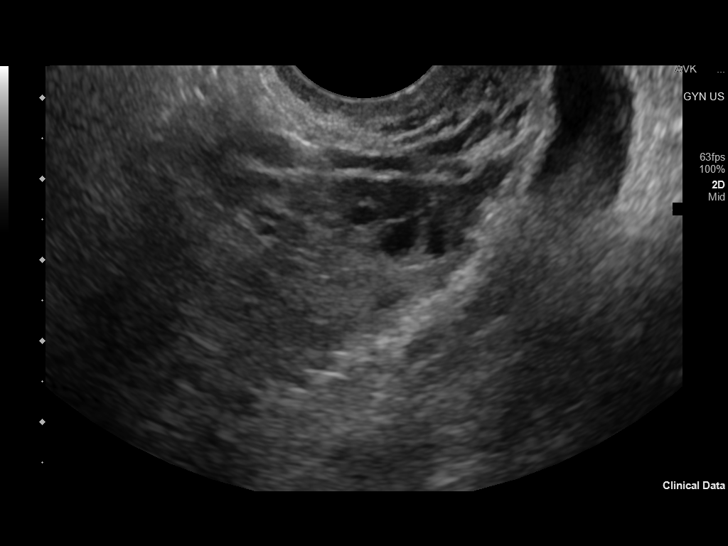
[im 94/125]
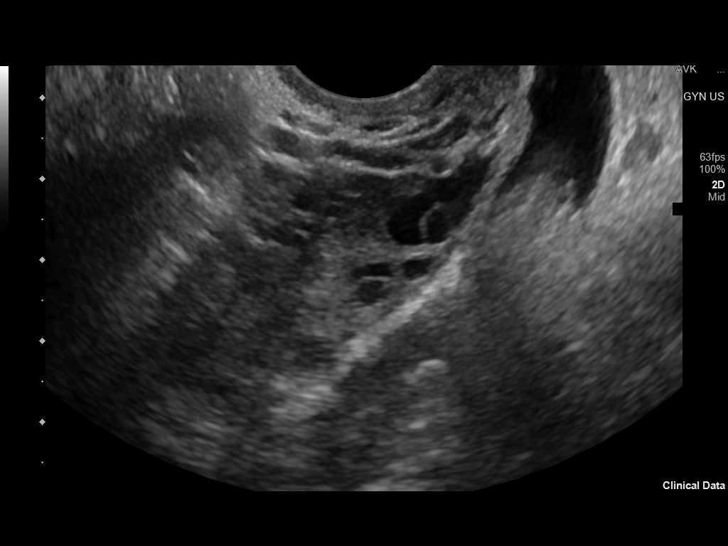
[im 104/125]
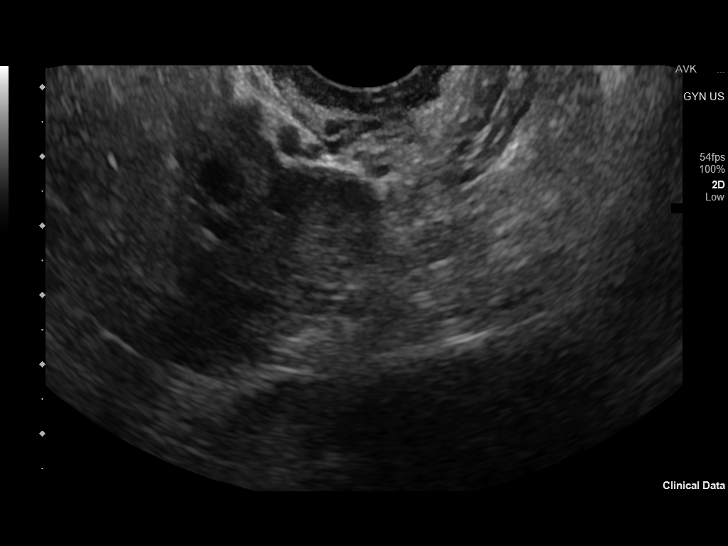
[im 114/125]
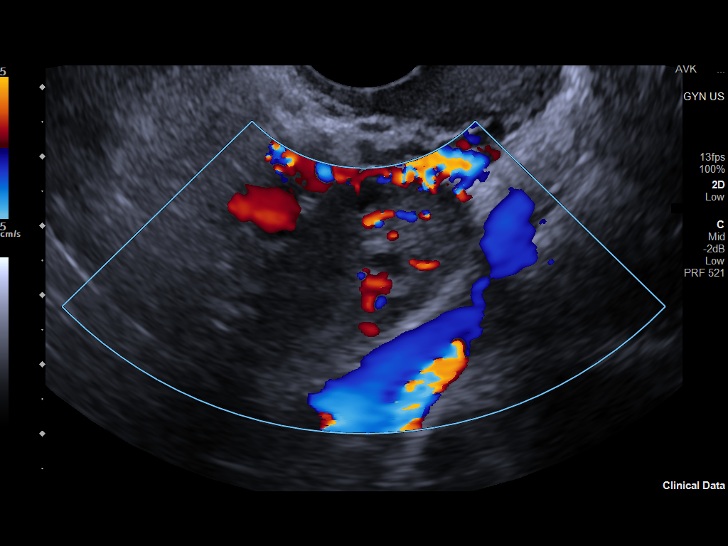
[im 125/125]
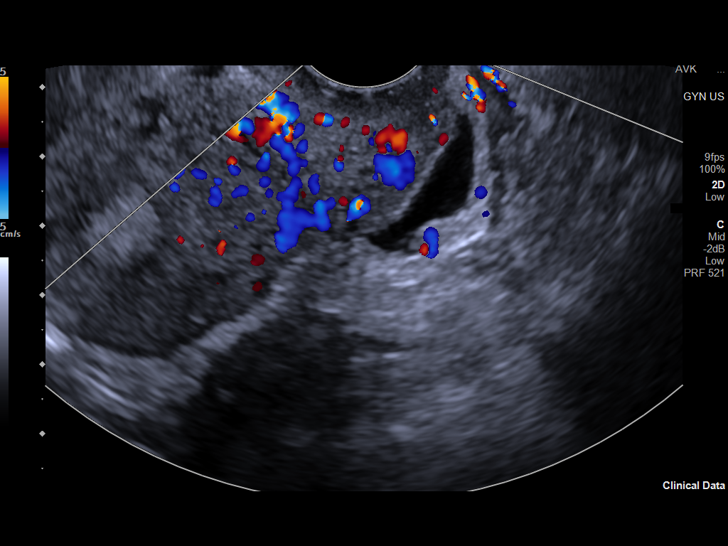

[14 of 25 positions shown; findings below may reference images not displayed]

FINDINGS: Uterus

Measurements: 7.2 x 3.8 x 5.2 cm = volume: 74 mL. No fibroids or
other mass visualized.

Endometrium

Thickness: 9 mm.  No focal abnormality visualized.

Right ovary

Measurements: 2.6 x 2.3 x 2.26 = volume: 6.8 mL. Contains multiple
small primarily peripheral follicles.

Left ovary

Measurements: 3.5 x 2.0 x 2.1 cm = volume: 7.7 mL. Contains multiple
small primarily peripheral follicles.

Other findings

A small amount of physiologic fluid is seen in the pelvis.
IMPRESSION: 1. Both ovaries contain multiple small primarily peripheral
follicles.
2. No other abnormalities.

## 2021-11-13 ENCOUNTER — Ambulatory Visit (INDEPENDENT_AMBULATORY_CARE_PROVIDER_SITE_OTHER): Payer: BC Managed Care – PPO

## 2021-11-13 ENCOUNTER — Ambulatory Visit
Admission: EM | Admit: 2021-11-13 | Discharge: 2021-11-13 | Disposition: A | Discharge status: Home / Self Care | Payer: BC Managed Care – PPO | Attending: Emergency Medicine | Admitting: Emergency Medicine

## 2021-11-13 ENCOUNTER — Encounter: Payer: Self-pay | Admitting: Emergency Medicine

## 2021-11-13 DIAGNOSIS — M79675 Pain in left toe(s): Secondary | ICD-10-CM | POA: Diagnosis not present

## 2021-11-13 DIAGNOSIS — S90122A Contusion of left lesser toe(s) without damage to nail, initial encounter: Secondary | ICD-10-CM | POA: Diagnosis not present

## 2021-11-13 NOTE — ED Provider Notes (Signed)
[facility]    CSN: [id number] Arrival date & time: 11/13/21  0901      History   Chief Complaint Chief Complaint  Patient presents with   Toe Injury    Left 4th toe    HPI Dominique Morris is a 21 y.o. female.   HPI  21 year old female here for evaluation of toe pain.  Patient reports that 2 nights ago she her left fourth toe on a coffee table and she has been experiencing pain, swelling, and bruising ever since.  She states initially there was some numbness but there is no numbness or tingling now.  She does have full sensation and range of motion of her toe.  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 Authorizing Provider  levothyroxine (SYNTHROID) 137 MCG tablet Take 137 mcg by mouth daily before breakfast.   Yes [provider]  liothyronine (CYTOMEL) 5 MCG tablet Take by mouth. 10/13/20 11/13/21 Yes [provider]  norgestimate-ethinyl estradiol (ORTHO-CYCLEN) 0.25-35 MG-MCG tablet Take 1 tablet by mouth daily. 10/14/20  Yes Hildred Laser, MD  dicyclomine (BENTYL) 10 MG capsule TAKE ONE CAPSULE BY MOUTH FOUR TIMES A DAY AS NEEDED FOR SPASMS 05/24/21   Wyline Mood, MD  ibuprofen (ADVIL) 800 MG tablet Take 1 tablet (800 mg total) by mouth every 8 (eight) hours as needed for cramping. 11/07/19   Hildred Laser, MD  Semaglutide, 2 MG/DOSE, (OZEMPIC, 2 MG/DOSE,) 8 MG/3ML SOPN Inject 2 mg into the skin once a week. 05/02/21   Sallyanne Kuster, NP    Family History Family History  Problem Relation Age of Onset   Endometriosis Mother    Cushing syndrome Mother    Hyperlipidemia Father    Endometriosis Maternal Aunt    Breast cancer Neg Hx    Ovarian cancer Neg Hx    Colon cancer Neg Hx     Social History Social History   Tobacco Use   Smoking status: Never   Smokeless tobacco: Never  Vaping Use   Vaping Use: Never used  Substance Use Topics   Alcohol use: Yes    Comment: socially   Drug use: No     Allergies   Latex and Shrimp (diagnostic)  Review of Systems Review of Systems  Musculoskeletal:  Positive for arthralgias and joint swelling.  Skin:  Positive for color change. Negative for wound.  Neurological:  Negative for weakness and numbness.  Hematological: Negative.   Psychiatric/Behavioral: Negative.       Physical Exam Triage Vital Signs ED Triage Vitals  Enc Vitals Group     BP 11/13/21 0924 116/81     Pulse Rate 11/13/21 0924 79     Resp 11/13/21 0924 14     Temp 11/13/21 0924 97.8 F (36.6 C)     Temp Source 11/13/21 0924 Oral     SpO2 11/13/21 0924 100 %     Weight 11/13/21 0921 250 lb (113.4 kg)     Height 11/13/21 0921 5\' 8"  (1.727 m)     Head Circumference --      Peak Flow --      Pain Score 11/13/21 0921 3     Pain Loc --      Pain Edu? --      Excl. in GC? --    No data found.  Updated Vital Signs BP 116/81 (BP Location: Left Arm)   Pulse 79    Temp 97.8 F (36.6 C) (Oral)   Resp 14   Ht 5\' 8"  (1.727 m)   Wt 250 lb (113.4 kg)   LMP 10/23/2021   SpO2 100%   BMI 38.01 kg/m   Visual Acuity Right Eye Distance:   Left Eye Distance:   Bilateral Distance:    Right Eye Near:   Left Eye Near:    Bilateral Near:     Physical Exam Vitals and nursing note reviewed.  Constitutional:      Appearance: Normal appearance. She is not ill-appearing.  HENT:     Head: Normocephalic and atraumatic.  Musculoskeletal:        General: Swelling, tenderness and signs of injury present. No deformity. Normal range of motion.  Skin:    General: Skin is warm and dry.     Capillary Refill: Capillary refill takes less than 2 seconds.     Findings: Bruising present.  Neurological:     General: No focal deficit present.     Mental Status: She is alert and oriented to person, place, and time.     Sensory: No sensory deficit.  Psychiatric:        Mood and Affect: Mood normal.        Behavior: Behavior normal.        Thought Content: Thought content normal.        Judgment: Judgment normal.      UC Treatments / Results  Labs (all labs ordered are listed, but only abnormal results are displayed) Labs Reviewed - No data to display  EKG   Radiology DG Toe 4th Left  Result Date: 11/13/2021 CLINICAL DATA:  Pain left fourth toe with swelling due to injury 2 days ago. EXAM: LEFT FOURTH TOE COMPARISON:  None Available. FINDINGS: There is no evidence of fracture or dislocation. There is no evidence of arthropathy or other focal bone abnormality. Soft tissues are unremarkable. IMPRESSION: Negative. Electronically Signed   By: 10/25/2021 M.D.   On: 11/13/2021 10:25    Procedures Procedures (including critical care time)  Medications Ordered in UC Medications - No data to display  Initial Impression / Assessment and Plan / UC Course  I have reviewed the triage vital signs and the nursing notes.  Pertinent labs & imaging results that were  available during my care of the patient were reviewed by me and considered in my medical decision making (see chart for details).  Patient is a very pleasant, nontoxic-appearing 21 year old female here for evaluation of bruising, swelling, and tenderness to the distal aspect of her left fourth toe.  She indicates that she kicked a coffee table 2 nights ago which led to the injury.  She does have pain with weightbearing but she is able to ambulate.  On exam patient's toe is in normal anatomical alignment but there is ecchymosis and edema of the distal phalanx and the DIP joint.  She does have full sensation at the tip and full range of motion.  There is no tenderness with palpation of the distal phalanx but there is tenderness with palpation of the DIP joint in the middle phalanx.  No tenderness with palpation of the MCP joint or the fourth metatarsal.  We will obtain radiograph of left fourth toe.  Left fourth toe radiograph independently reviewed and evaluated by me.  Impression: No evidence of fracture or dislocation.  Radiology overread is pending. Radiology impression states no evidence of fracture or dislocation.  There is no evidence of arthropathy or focal bone abnormality.  Soft tissues are unremarkable.  Negative exam.  I will discharge patient with a diagnosis of contusion of her left fourth toe and treat her conservatively with rest, ice, elevation, over-the-counter NSAIDs, and supportive care.  I have cautioned her against wearing shoes with a narrow toe box or high heels until after her pain resolves.  She states that buddy taping causes more pain so I have advised her to not use buddy taping since it increases her pain.  Work note provided.   Final Clinical Impressions(s) / UC Diagnoses   Final diagnoses:  Contusion of toe of left foot, initial encounter     Discharge Instructions      Keep your left foot elevated as much as possible to decrease swelling and pain.  Apply ice for  20 minutes at a time, 20-3 times a day for the first 48 hours. After that apply moist heat for 20 minutes at a time, 2-3 times a day.  Take OTC Ibuprofen according to the package instructions as needed for pain.  Wear shoes with a wide toe box until yoru pain has improved or resolved.     ED Prescriptions   None    PDMP not reviewed this encounter.   Margarette Canada, NP 11/13/21 1030

## 2021-11-13 NOTE — Discharge Instructions (Signed)
Keep your left foot elevated as much as possible to decrease swelling and pain.  Apply ice for 20 minutes at a time, 20-3 times a day for the first 48 hours. After that apply moist heat for 20 minutes at a time, 2-3 times a day.  Take OTC Ibuprofen according to the package instructions as needed for pain.  Wear shoes with a wide toe box until yoru pain has improved or resolved.

## 2021-11-13 NOTE — ED Triage Notes (Signed)
Patient states that she hit her toe on the coffee table on Friday night.  Patient c/o pain, bruising and swelling in her left 4th toe.

## 2022-01-21 ENCOUNTER — Encounter: Payer: Self-pay | Admitting: Obstetrics and Gynecology

## 2022-01-24 ENCOUNTER — Other Ambulatory Visit: Payer: Self-pay

## 2022-01-24 DIAGNOSIS — N920 Excessive and frequent menstruation with regular cycle: Secondary | ICD-10-CM

## 2022-01-24 MED ORDER — NORGESTIMATE-ETH ESTRADIOL 0.25-35 MG-MCG PO TABS: 1.0000 | ORAL_TABLET | Freq: Every day | ORAL | 0 refills | Status: DC

## 2022-05-03 ENCOUNTER — Ambulatory Visit: Payer: BC Managed Care – PPO | Admitting: Podiatry

## 2022-05-03 DIAGNOSIS — M778 Other enthesopathies, not elsewhere classified: Secondary | ICD-10-CM

## 2022-05-10 ENCOUNTER — Encounter: Payer: Self-pay | Admitting: Podiatry

## 2022-05-10 ENCOUNTER — Ambulatory Visit (INDEPENDENT_AMBULATORY_CARE_PROVIDER_SITE_OTHER): Payer: BC Managed Care – PPO

## 2022-05-10 ENCOUNTER — Ambulatory Visit: Payer: BC Managed Care – PPO | Admitting: Podiatry

## 2022-05-10 DIAGNOSIS — M7750 Other enthesopathy of unspecified foot: Secondary | ICD-10-CM

## 2022-05-10 DIAGNOSIS — M76829 Posterior tibial tendinitis, unspecified leg: Secondary | ICD-10-CM

## 2022-05-10 DIAGNOSIS — M7752 Other enthesopathy of left foot: Secondary | ICD-10-CM

## 2022-05-10 DIAGNOSIS — M7751 Other enthesopathy of right foot: Secondary | ICD-10-CM | POA: Diagnosis not present

## 2022-05-10 DIAGNOSIS — M76819 Anterior tibial syndrome, unspecified leg: Secondary | ICD-10-CM | POA: Diagnosis not present

## 2022-05-10 DIAGNOSIS — Q666 Other congenital valgus deformities of feet: Secondary | ICD-10-CM

## 2022-05-10 MED ORDER — MELOXICAM 15 MG PO TABS: 15.0000 mg | ORAL_TABLET | Freq: Every day | ORAL | 3 refills | Status: DC

## 2022-05-10 NOTE — Progress Notes (Signed)
Subjective:  Patient ID: Dominique Morris, female    DOB: Jul 27, 2000,  MRN: [id number] HPI Chief Complaint  Patient presents with   Ankle Pain    Ankle bilateral - active for years in sports, noticed feet turn inward when walking, server for work, so on feet all day, stiffness at the end of the day and also sometimes in the morning, weakness, tried ankle braces, but don't work well in her work shoes, with ROM has cracking and popping, history of bad arthritis in her family   New Patient (Initial Visit)    21 y.o. female presents with the above complaint.   ROS: Denies fever chills nausea vomiting muscle aches pains calf pain back pain chest pain shortness of breath.  Past Medical History:  Diagnosis Date   Dysmenorrhea    PCOS (polycystic ovarian syndrome)    Thyroid disease    Past Surgical History:  Procedure Laterality Date   OSTEOCHONDROMA EXCISION  02/2014   WISDOM TOOTH EXTRACTION  08/15/2019    Current Outpatient Medications:    meloxicam (MOBIC) 15 MG tablet, Take 1 tablet (15 mg total) by mouth daily., Disp: 30 tablet, Rfl: 3   dicyclomine (BENTYL) 10 MG capsule, TAKE ONE CAPSULE BY MOUTH FOUR TIMES A DAY AS NEEDED FOR SPASMS, Disp: 120 capsule, Rfl: 0   ibuprofen (ADVIL) 800 MG tablet, Take 1 tablet (800 mg total) by mouth every 8 (eight) hours as needed for cramping., Disp: 60 tablet, Rfl: 3   levothyroxine (SYNTHROID) 137 MCG tablet, Take 137 mcg by mouth daily before breakfast., Disp: , Rfl:    liothyronine (CYTOMEL) 5 MCG tablet, Take by mouth., Disp: , Rfl:    norgestimate-ethinyl estradiol (ORTHO-CYCLEN) 0.25-35 MG-MCG tablet, Take 1 tablet by mouth daily., Disp: 84 tablet, Rfl: 0   Semaglutide, 2 MG/DOSE, (OZEMPIC, 2 MG/DOSE,) 8 MG/3ML SOPN, Inject 2 mg into the skin once a week., Disp: 3 mL, Rfl: 1  Allergies  Allergen Reactions   Latex     Other reaction(s): Other (See Comments) Other Reaction: Sesitivity   Shrimp (Diagnostic)    Procyanidolic Oligomers  Itching and Rash   Review of Systems Objective:  There were no vitals filed for this visit.  General: Well developed, nourished, in no acute distress, alert and oriented x3   Dermatological: Skin is warm, dry and supple bilateral. Nails x 10 are well maintained; remaining integument appears unremarkable at this time. There are no open sores, no preulcerative lesions, no rash or signs of infection present.  Vascular: Dorsalis Pedis artery and Posterior Tibial artery pedal pulses are 2/4 bilateral with immedate capillary fill time. Pedal hair growth present. No varicosities and no lower extremity edema present bilateral.   Neruologic: Grossly intact via light touch bilateral. Vibratory intact via tuning fork bilateral. Protective threshold with Semmes Wienstein monofilament intact to all pedal sites bilateral. Patellar and Achilles deep tendon reflexes 2+ bilateral. No Babinski or clonus noted bilateral.   Musculoskeletal: No gross boney pedal deformities bilateral. No pain, crepitus, or limitation noted with foot and ankle range of motion bilateral. Muscular strength 5/5 in all groups tested bilateral.  Pes planovalgus flexible in nature bilateral.  She has tenderness on palpation of the posterior tibial tendon and the tibialis anterior tendon.  Gait: Unassisted, Nonantalgic.    Radiographs:  Radiographs taken today bilateral foot demonstrate an osseously mature individual no coalitions identified.  Pes planovalgus is noted bilateral.  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  Plan: She was casted today for orthotics.  I also started her on meloxicam 15 mg 1 p.o. daily     Dominique Morris, [place]

## 2022-05-11 ENCOUNTER — Telehealth: Payer: Self-pay | Admitting: Podiatry

## 2022-05-11 NOTE — Telephone Encounter (Signed)
Message left on voicemail to call back about her orthotic order .    Left a message on voicemail for patient to call back.

## 2022-05-17 ENCOUNTER — Ambulatory Visit: Payer: BC Managed Care – PPO | Admitting: Dermatology

## 2022-05-17 DIAGNOSIS — L858 Other specified epidermal thickening: Secondary | ICD-10-CM

## 2022-05-17 DIAGNOSIS — L219 Seborrheic dermatitis, unspecified: Secondary | ICD-10-CM | POA: Diagnosis not present

## 2022-05-17 DIAGNOSIS — R21 Rash and other nonspecific skin eruption: Secondary | ICD-10-CM

## 2022-05-17 MED ORDER — EUCRISA 2 % EX OINT: TOPICAL_OINTMENT | CUTANEOUS | 1 refills | Status: DC

## 2022-05-17 NOTE — Progress Notes (Signed)
   Follow-Up Visit   Subjective  Dominique Morris is a 21 y.o. female who presents for the following: Rash (Left chest, present for almost a year, itchy only once, never goes away. Hx of eczema.). No change in size. She uses Dove unscented soap and Lubriderm or Aveeno moisturizer. She gets a rash behind her left ear.   The following portions of the chart were reviewed this encounter and updated as appropriate:       Review of Systems:  No other skin or systemic complaints except as noted in HPI or Assessment and Plan.  Objective  Well appearing patient in no apparent distress; mood and affect are within normal limits.  A focused examination was performed including chest, arms. Relevant physical exam findings are noted in the Assessment and Plan.  Left Chest Indistinct light pink patch with follicular prominence.  Left Postauricular Pink patches with greasy scale.     Assessment & Plan  Rash Left Chest  Mild eczema vs Keratosis Pilaris. Benign-appearing  Start Eucrisa Ointment apply to AA BID until improved. Sample given.   Seborrheic dermatitis Left Postauricular  vs Atopic Dermatitis. Chronic and persistent condition with duration or expected duration over one year. Condition is bothersome/symptomatic for patient. Currently flared.   Seborrheic Dermatitis  -  is a chronic persistent rash characterized by pinkness and scaling most commonly of the mid face but also can occur on the scalp (dandruff), ears; mid chest, mid back and groin.  It tends to be exacerbated by stress and cooler weather.  People who have neurologic disease may experience new onset or exacerbation of existing seborrheic dermatitis.  The condition is not curable but treatable and can be controlled.  Start Eucrisa Ointment Apply to AA BID dsp 60g 1Rf.   Crisaborole (EUCRISA) 2 % OINT - Left Postauricular Apply to affected areas rash twice daily until improved.  Keratosis Pilaris - Tiny follicular  keratotic papules of the upper arms - Benign. Genetic in nature. No cure. - Observe. - If desired, patient can use an emollient (moisturizer) containing ammonium lactate, urea or salicylic acid once a day to smooth the area  Return if symptoms worsen or fail to improve.  ICherlyn Labella, CMA, am acting as scribe for Willeen Niece, MD .  Documentation: I have reviewed the above documentation for accuracy and completeness, and I agree with the above.  Willeen Niece MD

## 2022-05-17 NOTE — Patient Instructions (Addendum)
Recommend starting moisturizer with exfoliant (Urea, Salicylic acid, or Lactic acid) one to two times daily to help smooth rough and bumpy skin.  OTC options include Cetaphil Rough and Bumpy lotion (Urea), Eucerin Roughness Relief lotion or spot treatment cream (Urea), CeraVe SA lotion/cream for Rough and Bumpy skin (Sal Acid), Gold Bond Rough and Bumpy cream (Sal Acid), and AmLactin 12% lotion/cream (Lactic Acid).  If applying in morning, also apply sunscreen to sun-exposed areas, since these exfoliating moisturizers can increase sensitivity to sun.   Due to recent changes in healthcare laws, you may see results of your pathology and/or laboratory studies on MyChart before the doctors have had a chance to review them. We understand that in some cases there may be results that are confusing or concerning to you. Please understand that not all results are received at the same time and often the doctors may need to interpret multiple results in order to provide you with the best plan of care or course of treatment. Therefore, we ask that you please give us 2 business days to thoroughly review all your results before contacting the office for clarification. Should we see a critical lab result, you will be contacted sooner.   If You Need Anything After Your Visit  If you have any questions or concerns for your doctor, please call our main line at [phone] and press option 4 to reach your doctor's medical assistant. If no one answers, please leave a voicemail as directed and we will return your call as soon as possible. Messages left after 4 pm will be answered the following business day.   You may also send us a message via MyChart. We typically respond to MyChart messages within 1-2 business days.  For prescription refills, please ask your pharmacy to contact our office. Our fax number is [phone].  If you have an urgent issue when the clinic is closed that cannot wait until the next business day,  you can page your doctor at the number below.    Please note that while we do our best to be available for urgent issues outside of office hours, we are not available 24/7.   If you have an urgent issue and are unable to reach us, you may choose to seek medical care at your doctor's office, retail clinic, urgent care center, or emergency room.  If you have a medical emergency, please immediately call 911 or go to the emergency department.  Pager Numbers  - Dr. Kowalski: [phone]  - Dr. Moye: [phone]  - Dr. Stewart: [phone]  In the event of inclement weather, please call our main line at [phone] for an update on the status of any delays or closures.  Dermatology Medication Tips: Please keep the boxes that topical medications come in in order to help keep track of the instructions about where and how to use these. Pharmacies typically print the medication instructions only on the boxes and not directly on the medication tubes.   If your medication is too expensive, please contact our office at [phone] option 4 or send us a message through MyChart.   We are unable to tell what your co-pay for medications will be in advance as this is different depending on your insurance coverage. However, we may be able to find a substitute medication at lower cost or fill out paperwork to get insurance to cover a needed medication.   If a prior authorization is required to get your medication covered by your insurance company, please allow   us 1-2 business days to complete this process.  Drug prices often vary depending on where the prescription is filled and some pharmacies may offer cheaper prices.  The website www.goodrx.com contains coupons for medications through different pharmacies. The prices here do not account for what the cost may be with help from insurance (it may be cheaper with your insurance), but the website can give you the price if you did not use any insurance.   - You can print the associated coupon and take it with your prescription to the pharmacy.  - You may also stop by our office during regular business hours and pick up a GoodRx coupon card.  - If you need your prescription sent electronically to a different pharmacy, notify our office through [place] MyChart or by phone at [phone] option 4.     Si Usted Necesita Algo Despus de Su Visita  Tambin puede enviarnos un mensaje a travs de MyChart. Por lo general respondemos a los mensajes de MyChart en el transcurso de 1 a 2 das hbiles.  Para renovar recetas, por favor pida a su farmacia que se ponga en contacto con nuestra oficina. Nuestro nmero de fax es el [phone].  Si tiene un asunto urgente cuando la clnica est cerrada y que no puede esperar hasta el siguiente da hbil, puede llamar/localizar a su doctor(a) al nmero que aparece a continuacin.   Por favor, tenga en cuenta que aunque hacemos todo lo posible para estar disponibles para asuntos urgentes fuera del horario de oficina, no estamos disponibles las 24 horas del da, los 7 das de la semana.   Si tiene un problema urgente y no puede comunicarse con nosotros, puede optar por buscar atencin mdica  en el consultorio de su doctor(a), en una clnica privada, en un centro de atencin urgente o en una sala de emergencias.  Si tiene una emergencia mdica, por favor llame inmediatamente al 911 o vaya a la sala de emergencias.  Nmeros de bper  - Dr. Kowalski: [phone]  - Dra. Moye: [phone]  - Dra. Stewart: [phone]  En caso de inclemencias del tiempo, por favor llame a nuestra lnea principal al [phone] para una actualizacin sobre el estado de cualquier retraso o cierre.  Consejos para la medicacin en dermatologa: Por favor, guarde las cajas en las que vienen los medicamentos de uso tpico para ayudarle a seguir las instrucciones sobre dnde y cmo usarlos. Las farmacias generalmente  imprimen las instrucciones del medicamento slo en las cajas y no directamente en los tubos del medicamento.   Si su medicamento es muy caro, por favor, pngase en contacto con nuestra oficina llamando al [phone] y presione la opcin 4 o envenos un mensaje a travs de MyChart.   No podemos decirle cul ser su copago por los medicamentos por adelantado ya que esto es diferente dependiendo de la cobertura de su seguro. Sin embargo, es posible que podamos encontrar un medicamento sustituto a menor costo o llenar un formulario para que el seguro cubra el medicamento que se considera necesario.   Si se requiere una autorizacin previa para que su compaa de seguros cubra su medicamento, por favor permtanos de 1 a 2 das hbiles para completar este proceso.  Los precios de los medicamentos varan con frecuencia dependiendo del lugar de dnde se surte la receta y alguna farmacias pueden ofrecer precios ms baratos.  El sitio web www.goodrx.com tiene cupones para medicamentos de diferentes farmacias. Los precios aqu no tienen en cuenta lo que podra   costar con la ayuda del seguro (puede ser ms barato con su seguro), pero el sitio web puede darle el precio si no utiliz ningn seguro.  - Puede imprimir el cupn correspondiente y llevarlo con su receta a la farmacia.  - Tambin puede pasar por nuestra oficina durante el horario de atencin regular y recoger una tarjeta de cupones de GoodRx.  - Si necesita que su receta se enve electrnicamente a una farmacia diferente, informe a nuestra oficina a travs de MyChart de [place] o por telfono llamando al [phone] y presione la opcin 4.  

## 2022-06-08 ENCOUNTER — Telehealth: Payer: Self-pay

## 2022-06-08 NOTE — Telephone Encounter (Signed)
Patient's left VM Eucrisa not covered by insurance and asking for alternative.

## 2022-06-12 MED ORDER — PIMECROLIMUS 1 % EX CREA: TOPICAL_CREAM | Freq: Two times a day (BID) | CUTANEOUS | 0 refills | Status: DC

## 2022-06-12 NOTE — Telephone Encounter (Signed)
Pimecrolimus sent in and patient advised of medication change. aw

## 2022-06-21 ENCOUNTER — Ambulatory Visit (INDEPENDENT_AMBULATORY_CARE_PROVIDER_SITE_OTHER): Payer: BC Managed Care – PPO | Admitting: Podiatry

## 2022-06-21 DIAGNOSIS — M7750 Other enthesopathy of unspecified foot: Secondary | ICD-10-CM

## 2022-06-21 DIAGNOSIS — M76819 Anterior tibial syndrome, unspecified leg: Secondary | ICD-10-CM

## 2022-06-21 DIAGNOSIS — Q666 Other congenital valgus deformities of feet: Secondary | ICD-10-CM

## 2022-06-21 NOTE — Progress Notes (Signed)
Patient presents today to pick up custom molded foot orthotics recommended by Dr. Myra Gianotti   Orthotics were dispensed and fit was satisfactory. Reviewed instructions for break-in and wear. Written instructions given to patient.  Patient will follow up as needed.   [employer]

## 2022-06-21 NOTE — Patient Instructions (Signed)

## 2022-08-20 ENCOUNTER — Encounter: Payer: Self-pay | Admitting: Obstetrics and Gynecology

## 2022-11-05 IMAGING — CR DG TOE 4TH 2+V*L*
3 series · 3 of 3 positions shown · non-contrast
Comparison: None Available.

CLINICAL DATA: Pain left fourth toe with swelling due to injury 2
days ago.

EXAM:
LEFT FOURTH TOE

[toe ap]
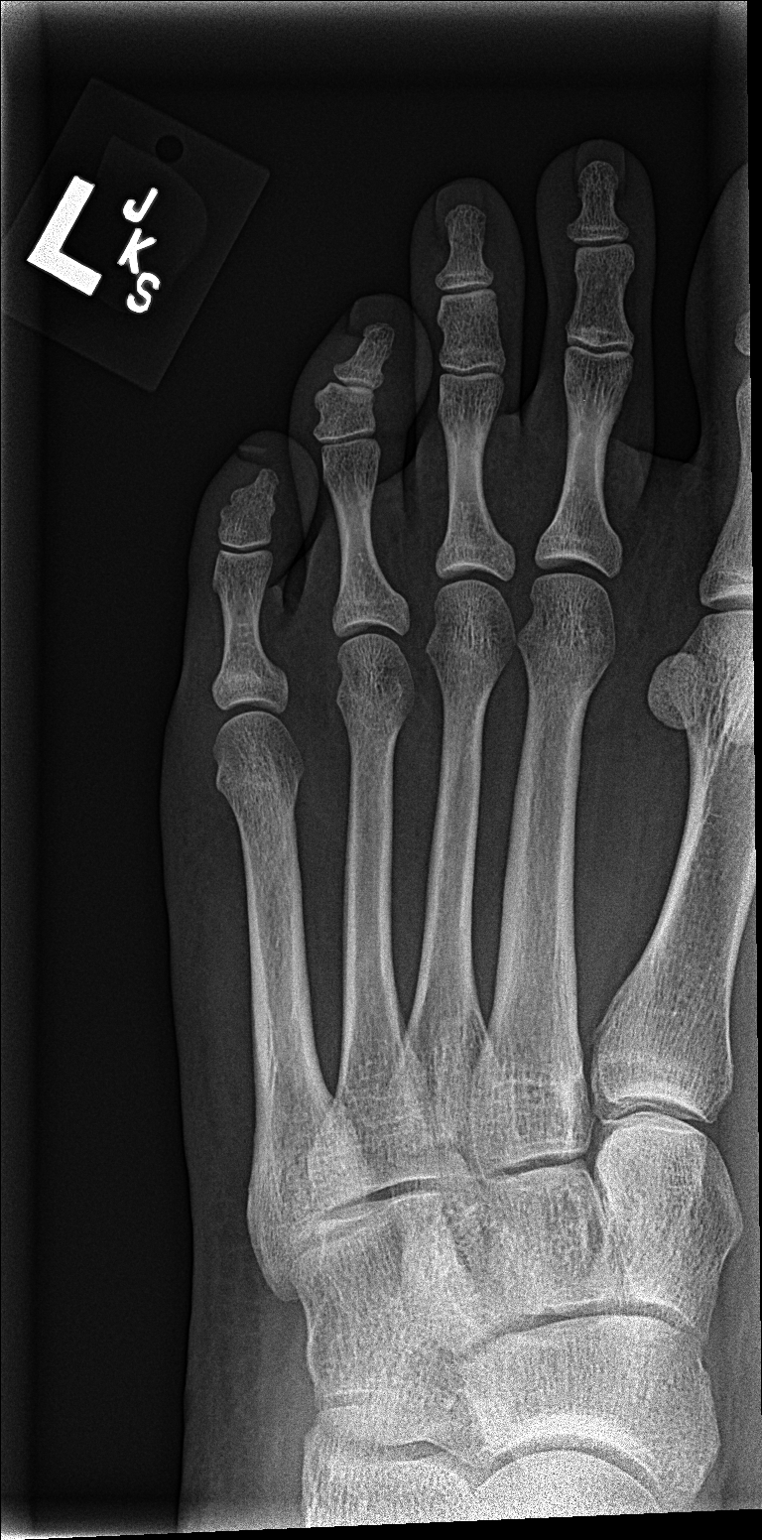

[toe obl]
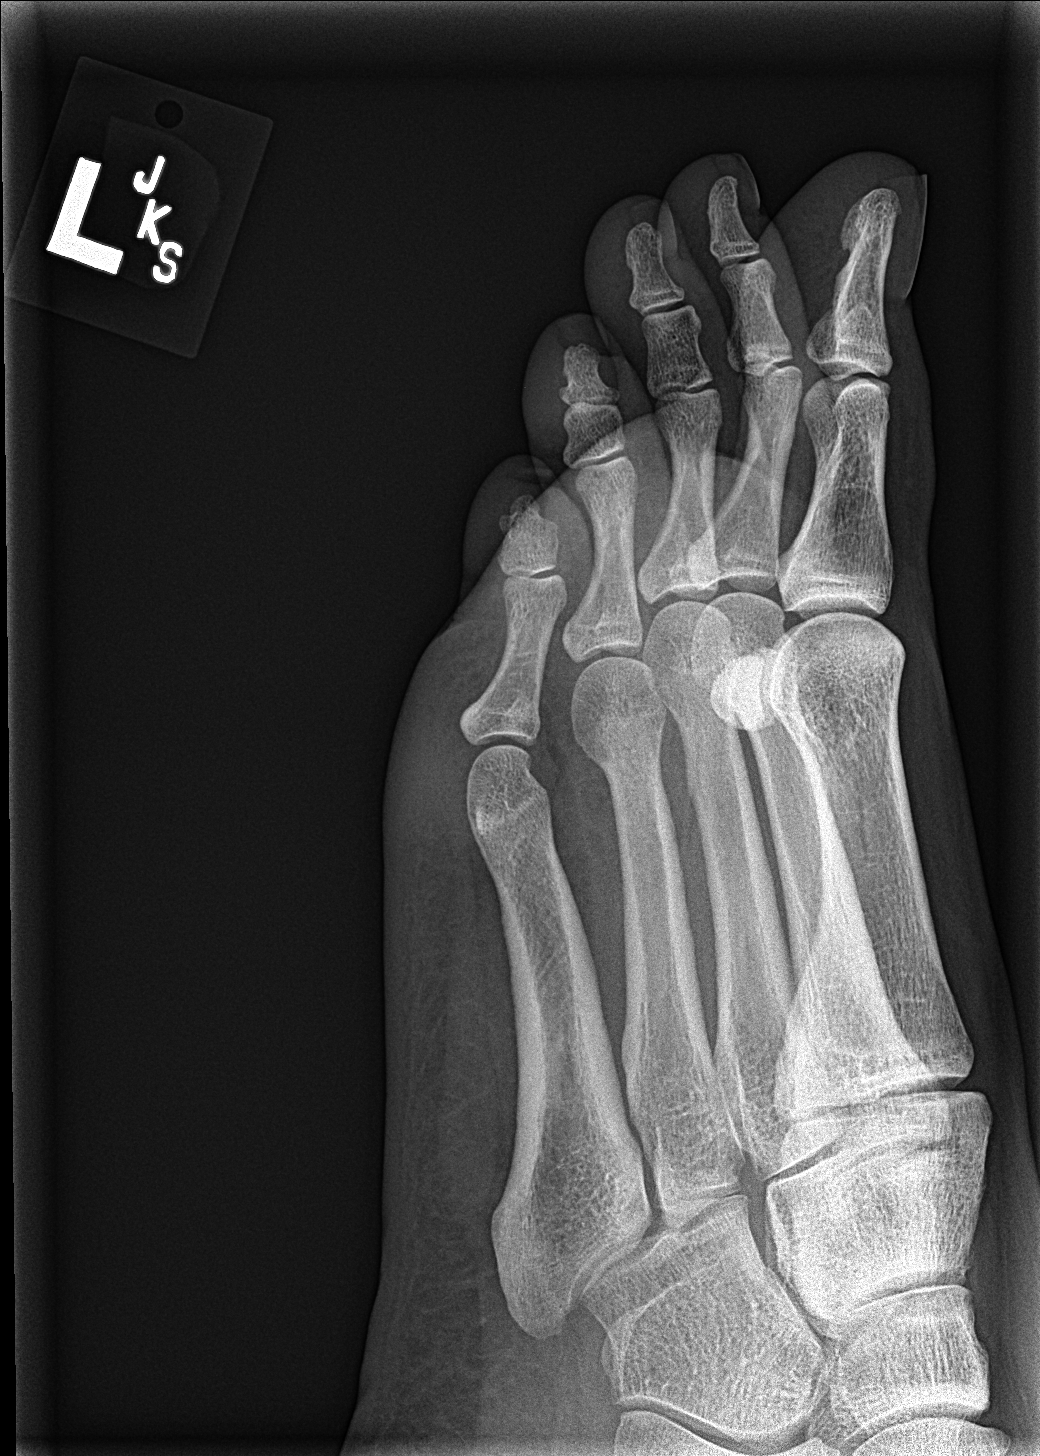

[toe lat]
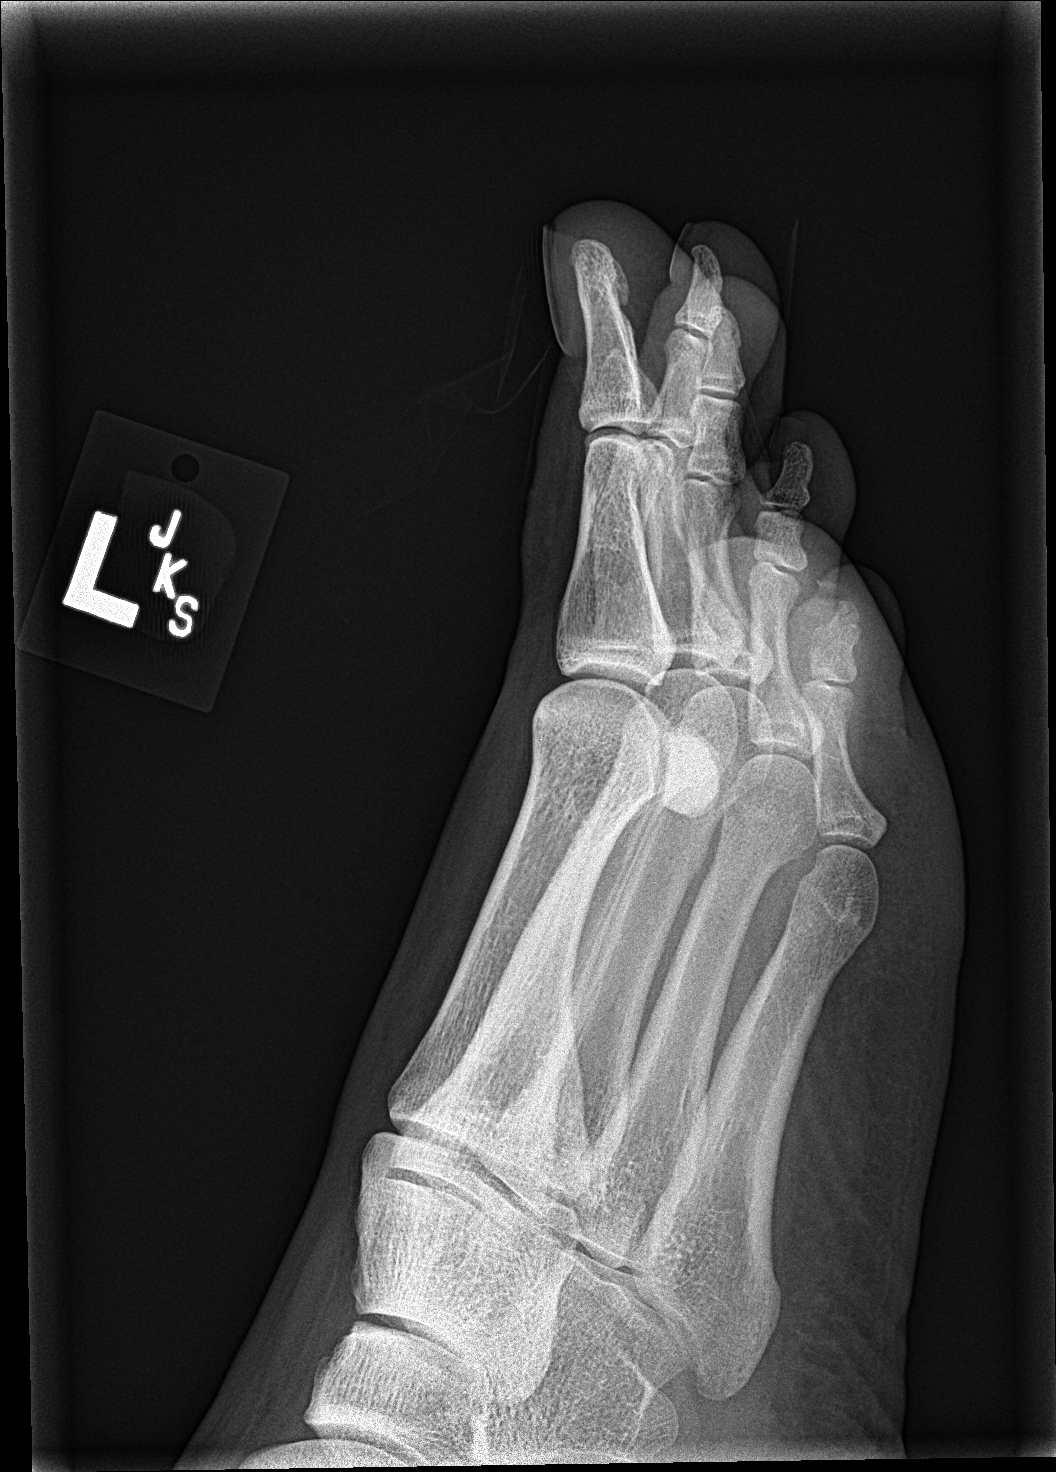

[3 of 3 positions shown; findings below may reference images not displayed]

FINDINGS: There is no evidence of fracture or dislocation. There is no
evidence of arthropathy or other focal bone abnormality. Soft
tissues are unremarkable.
IMPRESSION: Negative.

## 2023-02-21 ENCOUNTER — Ambulatory Visit (INDEPENDENT_AMBULATORY_CARE_PROVIDER_SITE_OTHER): Payer: BC Managed Care – PPO

## 2023-02-21 ENCOUNTER — Ambulatory Visit
Admission: EM | Admit: 2023-02-21 | Discharge: 2023-02-21 | Disposition: A | Discharge status: Home / Self Care | Payer: BC Managed Care – PPO | Attending: Emergency Medicine | Admitting: Emergency Medicine

## 2023-02-21 DIAGNOSIS — M25561 Pain in right knee: Secondary | ICD-10-CM

## 2023-02-21 MED ORDER — IBUPROFEN 600 MG PO TABS: 600.0000 mg | ORAL_TABLET | Freq: Four times a day (QID) | ORAL | 0 refills | Status: DC | PRN
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
## 2023-02-21 NOTE — Discharge Instructions (Addendum)
Take the ibuprofen as prescribed.  Rest and elevate your knee.  Apply ice packs 2-3 times a day for up to 20 minutes each.      Follow up with an orthopedist if your symptoms are not improving.

## 2023-03-01 ENCOUNTER — Other Ambulatory Visit: Payer: Self-pay

## 2023-03-01 MED ORDER — NORGESTIMATE-ETH ESTRADIOL 0.25-35 MG-MCG PO TABS: 1.0000 | ORAL_TABLET | Freq: Every day | ORAL | 0 refills | Status: DC

## 2023-03-01 NOTE — Telephone Encounter (Signed)
Pt calling; has scheduled annual; needs bc refill.  [phone]  Pt aware refill eRx'd.

## 2023-03-29 ENCOUNTER — Encounter: Payer: Self-pay | Admitting: Obstetrics and Gynecology

## 2023-03-29 ENCOUNTER — Ambulatory Visit: Payer: BC Managed Care – PPO | Admitting: Obstetrics and Gynecology

## 2023-03-29 VITALS — BP 112/78 | HR 74 | Resp 16 | Ht 69.0 in | Wt 285.3 lb

## 2023-03-29 DIAGNOSIS — R399 Unspecified symptoms and signs involving the genitourinary system: Secondary | ICD-10-CM | POA: Diagnosis not present

## 2023-03-29 DIAGNOSIS — Z01411 Encounter for gynecological examination (general) (routine) with abnormal findings: Secondary | ICD-10-CM | POA: Diagnosis not present

## 2023-03-29 DIAGNOSIS — Z01419 Encounter for gynecological examination (general) (routine) without abnormal findings: Secondary | ICD-10-CM

## 2023-03-29 DIAGNOSIS — N920 Excessive and frequent menstruation with regular cycle: Secondary | ICD-10-CM

## 2023-03-29 DIAGNOSIS — E063 Autoimmune thyroiditis: Secondary | ICD-10-CM

## 2023-03-29 LAB — POCT URINALYSIS DIPSTICK OB
Bilirubin, UA: NEGATIVE
Glucose, UA: NEGATIVE
Ketones, UA: NEGATIVE
Leukocytes, UA: NEGATIVE
Nitrite, UA: NEGATIVE
POC,PROTEIN,UA: NEGATIVE
Spec Grav, UA: 1.01 (ref 1.010–1.025)
Urobilinogen, UA: 0.2 U/dL
pH, UA: 8 (ref 5.0–8.0)

## 2023-03-29 MED ORDER — NORGESTIMATE-ETH ESTRADIOL 0.25-35 MG-MCG PO TABS: 1.0000 | ORAL_TABLET | Freq: Every day | ORAL | 3 refills | Status: DC

## 2023-03-29 NOTE — Progress Notes (Signed)
GYNECOLOGY ANNUAL PHYSICAL EXAM PROGRESS NOTE  Subjective:    Dominique Morris is a 22 y.o. G0P0000 female with a history of dysmenorrhea and thyroid disease who presents for an annual exam. The patient is not currently sexually active.  The patient wears seatbelts: yes. Has the patient ever been transfused or tattooed?: no. The patient reports that there is not domestic violence in her life.      The patient has the following complaints today: Thinks she may have a slight UTI. Has been noting some discomfort with urination for the past several days.   Menstrual History: Menarche age: [age] Patient's last menstrual period was 03/16/2023 (exact date). Period Cycle (Days): 21 Period Duration (Days): 5 Period Pattern: Regular Menstrual Flow: Moderate Menstrual Control: Other (Comment), Tampon Menstrual Control Change Freq (Hours): 4 Dysmenorrhea: (!) Moderate Dysmenorrhea Symptoms: Cramping, Headache, Other (Comment)   Gynecologic History:  Contraception: OCP (estrogen/progesterone) History of STI's: Denies Last Pap: 10/18/ 2022 . Results were: normal.  Denies h/o abnormal pap smears. Last mammogram: Not age appropriate     Upstream - 03/29/23 1400       Pregnancy Intention Screening   Does the patient want to become pregnant in the next year? No    Does the patient's partner want to become pregnant in the next year? N/A    Would the patient like to discuss contraceptive options today? No      Contraception Wrap Up   Current Method Oral Contraceptive    End Method Oral Contraceptive    Contraception Counseling Provided No    How was the end contraceptive method provided? Prescription            The pregnancy intention screening data noted above was reviewed. Potential methods of contraception were discussed. The patient elected to proceed with Oral Contraceptive.    OB History  Gravida Para Term Preterm AB Living  0 0 0 0 0 0  SAB IAB Ectopic Multiple Live  Births  0 0 0 0 0    Past Medical History:  Diagnosis Date   Dysmenorrhea    PCOS (polycystic ovarian syndrome)    Thyroid disease     Past Surgical History:  Procedure Laterality Date   OSTEOCHONDROMA EXCISION  02/2014   WISDOM TOOTH EXTRACTION  08/15/2019    Family History  Problem Relation Age of Onset   Endometriosis Mother    Cushing syndrome Mother    Hyperlipidemia Father    Endometriosis Maternal Aunt    Breast cancer Neg Hx    Ovarian cancer Neg Hx    Colon cancer Neg Hx     Social History   Socioeconomic History   Marital status: Single    Spouse name: Not on file   Number of children: Not on file   Years of education: Not on file   Highest education level: Not on file  Occupational History   Not on file  Tobacco Use   Smoking status: Never   Smokeless tobacco: Never  Vaping Use   Vaping status: Never Used  Substance and Sexual Activity   Alcohol use: Yes    Comment: socially   Drug use: No   Sexual activity: Yes    Birth control/protection: Pill, Condom  Other Topics Concern   Not on file  Social History Narrative   Not on file   Social Determinants of Health   Financial Resource Strain: Low Risk  (01/19/2021)   Received from [facility], [facility]   Overall Financial Resource Strain (CARDIA)    Difficulty of Paying Living Expenses: Not hard at all  Food Insecurity: No Food Insecurity (01/19/2021)   Received from [facility], [facility]   Hunger Vital Sign    Worried About Running Out of Food in the Last Year: Never true    Ran Out of Food in the Last Year: Never true  Transportation Needs: No Transportation Needs (01/19/2021)   Received from [facility], [facility]   [facility] - Transportation    In the past 12 months, has lack of transportation kept you from medical appointments or from getting medications?: No    Lack of  Transportation (Non-Medical): No  Physical Activity: Sufficiently Active (01/19/2021)   Received from [facility], [facility]   Exercise Vital Sign    Days of Exercise per Week: 5 days    Minutes of Exercise per Session: 60 min  Stress: No Stress Concern Present (01/19/2021)   Received from [facility], [facility]   [employer] [facility] Occupational [facility] - Occupational Stress Questionnaire    Feeling of Stress : Not at all  Social Connections: Not on file  Intimate Partner Violence: Not on file    Current Outpatient Medications on File Prior to Visit  Medication Sig Dispense Refill   Crisaborole (EUCRISA) 2 % OINT Apply to affected areas rash twice daily until improved. 60 g 1   ibuprofen (ADVIL) 600 MG tablet Take 1 tablet (600 mg total) by mouth every 6 (six) hours as needed. 30 tablet 0   levothyroxine (SYNTHROID) 137 MCG tablet Take 137 mcg by mouth daily before breakfast.     liothyronine (CYTOMEL) 5 MCG tablet Take by mouth.     meloxicam (MOBIC) 15 MG tablet Take 1 tablet (15 mg total) by mouth daily. (Patient not taking: Reported on 06/21/2022) 30 tablet 3   norgestimate-ethinyl estradiol (ORTHO-CYCLEN) 0.25-35 MG-MCG tablet Take 1 tablet by mouth daily. 28 tablet 0   pimecrolimus (ELIDEL) 1 % cream Apply topically 2 (two) times daily. 60 g 0   No current facility-administered medications on file prior to visit.    Allergies  Allergen Reactions   Latex     Other reaction(s): Other (See Comments) Other Reaction: Sesitivity   Shrimp (Diagnostic)    Procyanidolic Oligomers Itching and Rash     Review of Systems Constitutional: negative for chills, fatigue, fevers and sweats Eyes: negative for irritation, redness and visual disturbance Ears, nose, mouth, throat, and face: negative for hearing loss, nasal congestion, snoring and tinnitus Respiratory: negative for asthma, cough, sputum Cardiovascular:  negative for chest pain, dyspnea, exertional chest pressure/discomfort, irregular heart beat, palpitations and syncope Gastrointestinal: negative for abdominal pain, change in bowel habits, nausea and vomiting Genitourinary: negative for abnormal menstrual periods, genital lesions, sexual problems and vaginal discharge, dysuria and urinary incontinence Integument/breast: negative for breast lump, breast tenderness and nipple discharge Hematologic/lymphatic: negative for bleeding and easy bruising Musculoskeletal:negative for back pain and muscle weakness Neurological: negative for dizziness, headaches, vertigo and weakness Endocrine: negative for diabetic symptoms including polydipsia, polyuria and skin dryness Allergic/Immunologic: negative for hay fever and urticaria      Objective:  Blood pressure 112/78, pulse 74, resp. rate 16, height 5\' 9"  (1.753 m), weight 285 lb 4.8 oz (129.4 kg), last menstrual period 03/16/2023.  Body mass index is 42.13 kg/m.    General Appearance:  Alert, cooperative, no distress, appears stated age  Head:    Normocephalic, without obvious abnormality, atraumatic  Eyes:    PERRL, conjunctiva/corneas clear, EOM's intact, both eyes  Ears:    Normal external ear canals, both ears  Nose:   Nares normal, septum midline, mucosa normal, no drainage or sinus tenderness  Throat:   Lips, mucosa, and tongue normal; teeth and gums normal  Neck:   Supple, symmetrical, trachea midline, no adenopathy; thyroid: no enlargement/tenderness/nodules; no carotid bruit or JVD  Back:     Symmetric, no curvature, ROM normal, no CVA tenderness  Lungs:     Clear to auscultation bilaterally, respirations unlabored  Chest Wall:    No tenderness or deformity   Heart:    Regular rate and rhythm, S1 and S2 normal, no murmur, rub or gallop  Breast Exam:    No tenderness, masses, or nipple abnormality  Abdomen:     Soft, non-tender, bowel sounds active all four quadrants, no masses, no  organomegaly.    Genitalia:    Pelvic:external genitalia normal, vagina without lesions, discharge, or tenderness, rectovaginal septum  normal. Cervix normal in appearance, no cervical motion tenderness, no adnexal masses or tenderness.  Uterus normal size, shape, mobile, regular contours, nontender.  Rectal:    Normal external sphincter.  No hemorrhoids appreciated. Internal exam not done.   Extremities:   Extremities normal, atraumatic, no cyanosis or edema  Pulses:   2+ and symmetric all extremities  Skin:   Skin color, texture, turgor normal, no rashes or lesions  Lymph nodes:   Cervical, supraclavicular, and axillary nodes normal  Neurologic:   CNII-XII intact, normal strength, sensation and reflexes throughout     Labs:  Performed at outside facility  Results for orders placed or performed in visit on 03/29/23  POC Urinalysis Dipstick OB  Result Value Ref Range   Color, UA     Clarity, UA     Glucose, UA Negative Negative   Bilirubin, UA Negative    Ketones, UA Negative    Spec Grav, UA 1.010 1.010 - 1.025   Blood, UA Trace    pH, UA 8.0 5.0 - 8.0   POC,PROTEIN,UA Negative Negative, Trace, Small (1+), Moderate (2+), Large (3+), 4+   Urobilinogen, UA 0.2 0.2 or 1.0 E.U./dL   Nitrite, UA Negative    Leukocytes, UA Negative Negative   Appearance     Odor      Assessment:   1. Encounter for well woman exam with routine gynecological exam   2. Acquired autoimmune hypothyroidism   3. Obesity, morbid (HCC)   4. Menorrhagia with regular cycle   5. UTI symptoms      Plan:  Blood tests: None ordered. Has labs performed by Endocrinologist twice yearly.  Breast self exam technique reviewed and patient encouraged to perform self-exam monthly. Contraception: OCP (estrogen/progesterone).  Refill given.  Discussed healthy lifestyle modifications. Mammogram  : Not age appropriate Pap smear  UTD . Declined STI screening for GC/CT today as she notes she is not active at this time.   Flu vaccine: Declined Menorrhagia and dysmenorrhea managed with combined OCPs.  Hypothyroidism managed by PCP.  UA today performed for UTI symptoms.  UA overall negative, encouraged use of Azo or cranberry juice for urinary symptoms. Follow up in 1 year for annual exam   Hildred Laser, MD [facility]

## 2023-03-29 NOTE — Patient Instructions (Signed)
Preventive Care 21-23 Years Old, Female Preventive care refers to lifestyle choices and visits with your health care provider that can promote health and wellness. Preventive care visits are also called wellness exams. What can I expect for my preventive care visit? Counseling During your preventive care visit, your health care provider may ask about your: Medical history, including: Past medical problems. Family medical history. Pregnancy history. Current health, including: Menstrual cycle. Method of birth control. Emotional well-being. Home life and relationship well-being. Sexual activity and sexual health. Lifestyle, including: Alcohol, nicotine or tobacco, and drug use. Access to firearms. Diet, exercise, and sleep habits. Work and work environment. Sunscreen use. Safety issues such as seatbelt and bike helmet use. Physical exam Your health care provider may check your: Height and weight. These may be used to calculate your BMI (body mass index). BMI is a measurement that tells if you are at a healthy weight. Waist circumference. This measures the distance around your waistline. This measurement also tells if you are at a healthy weight and may help predict your risk of certain diseases, such as type 2 diabetes and high blood pressure. Heart rate and blood pressure. Body temperature. Skin for abnormal spots. What immunizations do I need?  Vaccines are usually given at various ages, according to a schedule. Your health care provider will recommend vaccines for you based on your age, medical history, and lifestyle or other factors, such as travel or where you work. What tests do I need? Screening Your health care provider may recommend screening tests for certain conditions. This may include: Pelvic exam and Pap test. Lipid and cholesterol levels. Diabetes screening. This is done by checking your blood sugar (glucose) after you have not eaten for a while (fasting). Hepatitis  B test. Hepatitis C test. HIV (human immunodeficiency virus) test. STI (sexually transmitted infection) testing, if you are at risk. BRCA-related cancer screening. This may be done if you have a family history of breast, ovarian, tubal, or peritoneal cancers. Talk with your health care provider about your test results, treatment options, and if necessary, the need for more tests. Follow these instructions at home: Eating and drinking  Eat a healthy diet that includes fresh fruits and vegetables, whole grains, lean protein, and low-fat dairy products. Take vitamin and mineral supplements as recommended by your health care provider. Do not drink alcohol if: Your health care provider tells you not to drink. You are pregnant, may be pregnant, or are planning to become pregnant. If you drink alcohol: Limit how much you have to 0-1 drink a day. Know how much alcohol is in your drink. In the [place] one drink equals one 12 oz bottle of beer (355 mL), one 5 oz glass of wine (148 mL), or one 1 oz glass of hard liquor (44 mL). Lifestyle Brush your teeth every morning and night with fluoride toothpaste. Floss one time each day. Exercise for at least 30 minutes 5 or more days each week. Do not use any products that contain nicotine or tobacco. These products include cigarettes, chewing tobacco, and vaping devices, such as e-cigarettes. If you need help quitting, ask your health care provider. Do not use drugs. If you are sexually active, practice safe sex. Use a condom or other form of protection to prevent STIs. If you do not wish to become pregnant, use a form of birth control. If you plan to become pregnant, see your health care provider for a prepregnancy visit. Find healthy ways to manage stress, such as: Meditation,   yoga, or listening to music. Journaling. Talking to a trusted person. Spending time with friends and family. Minimize exposure to UV radiation to reduce your risk of skin  cancer. Safety Always wear your seat belt while driving or riding in a vehicle. Do not drive: If you have been drinking alcohol. Do not ride with someone who has been drinking. If you have been using any mind-altering substances or drugs. While texting. When you are tired or distracted. Wear a helmet and other protective equipment during sports activities. If you have firearms in your house, make sure you follow all gun safety procedures. Seek help if you have been physically or sexually abused. What's next? Go to your health care provider once a year for an annual wellness visit. Ask your health care provider how often you should have your eyes and teeth checked. Stay up to date on all vaccines. This information is not intended to replace advice given to you by your health care provider. Make sure you discuss any questions you have with your health care provider. Document Revised: 11/10/2020 Document Reviewed: 11/10/2020 Elsevier Patient Education  2024 Else[employer] Inc. Breast Self-Awareness Breast self-awareness is knowing how your breasts look and feel. You need to: Check your breasts on a regular basis. Tell your doctor about any changes. Become familiar with the look and feel of your breasts. This can help you catch a breast problem while it is still small and can be treated. You should do breast self-exams even if you have breast implants. What you need: A mirror. A well-lit room. A pillow or other soft object. How to do a breast self-exam Follow these steps to do a breast self-exam: Look for changes  Take off all the clothes above your waist. Stand in front of a mirror in a room with good lighting. Put your hands down at your sides. Compare your breasts in the mirror. Look for any difference between them, such as: A difference in shape. A difference in size. Wrinkles, dips, and bumps in one breast and not the other. Look at each breast for changes in the skin, such  as: Redness. Scaly areas. Skin that has gotten thicker. Dimpling. Open sores (ulcers). Look for changes in your nipples, such as: Fluid coming out of a nipple. Fluid around a nipple. Bleeding. Dimpling. Redness. A nipple that looks pushed in (retracted), or that has changed position. Feel for changes Lie on your back. Feel each breast. To do this: Pick a breast to feel. Place a pillow under the shoulder closest to that breast. Put the arm closest to that breast behind your head. Feel the nipple area of that breast using the hand of your other arm. Feel the area with the pads of your three middle fingers by making small circles with your fingers. Use light, medium, and firm pressure. Continue the overlapping circles, moving downward over the breast. Keep making circles with your fingers. Stop when you feel your ribs. Start making circles with your fingers again, this time going upward until you reach your collarbone. Then, make circles outward across your breast and into your armpit area. Squeeze your nipple. Check for discharge and lumps. Repeat these steps to check your other breast. Sit or stand in the tub or shower. With soapy water on your skin, feel each breast the same way you did when you were lying down. Write down what you find Writing down what you find can help you remember what to tell your doctor. Write down: What is   normal for each breast. Any changes you find in each breast. These include: The kind of changes you find. A tender or painful breast. Any lump you find. Write down its size and where it is. When you last had your monthly period (menstrual cycle). General tips If you are breastfeeding, the best time to check your breasts is after you feed your baby or after you use a breast pump. If you get monthly bleeding, the best time to check your breasts is 5-7 days after your monthly cycle ends. With time, you will become comfortable with the self-exam. You will  also start to know if there are changes in your breasts. Contact a doctor if: You see a change in the shape or size of your breasts or nipples. You see a change in the skin of your breast or nipples, such as red or scaly skin. You have fluid coming from your nipples that is not normal. You find a new lump or thick area. You have breast pain. You have any concerns about your breast health. Summary Breast self-awareness includes looking for changes in your breasts and feeling for changes within your breasts. You should do breast self-awareness in front of a mirror in a well-lit room. If you get monthly periods (menstrual cycles), the best time to check your breasts is 5-7 days after your period ends. Tell your doctor about any changes you see in your breasts. Changes include changes in size, changes on the skin, painful or tender breasts, or fluid from your nipples that is not normal. This information is not intended to replace advice given to you by your health care provider. Make sure you discuss any questions you have with your health care provider. Document Revised: 10/20/2021 Document Reviewed: 03/17/2021 Elsevier Patient Education  2024 Elsevier Inc.  

## 2023-04-03 ENCOUNTER — Other Ambulatory Visit: Payer: Self-pay

## 2023-04-03 ENCOUNTER — Encounter: Payer: Self-pay | Admitting: Obstetrics and Gynecology

## 2023-04-03 DIAGNOSIS — R399 Unspecified symptoms and signs involving the genitourinary system: Secondary | ICD-10-CM

## 2023-04-03 LAB — URINE CULTURE

## 2023-04-03 MED ORDER — SULFAMETHOXAZOLE-TRIMETHOPRIM 800-160 MG PO TABS
1.0000 | ORAL_TABLET | Freq: Two times a day (BID) | ORAL | 1 refills | Status: DC
Start: 2023-04-03 — End: 2023-08-29

## 2023-07-24 ENCOUNTER — Encounter: Payer: Self-pay | Admitting: Emergency Medicine

## 2023-07-24 ENCOUNTER — Ambulatory Visit
Admission: EM | Admit: 2023-07-24 | Discharge: 2023-07-24 | Disposition: A | Discharge status: Home / Self Care | Payer: BC Managed Care – PPO | Attending: Emergency Medicine | Admitting: Emergency Medicine

## 2023-07-24 ENCOUNTER — Other Ambulatory Visit: Payer: Self-pay

## 2023-07-24 DIAGNOSIS — H6503 Acute serous otitis media, bilateral: Secondary | ICD-10-CM | POA: Diagnosis not present

## 2023-07-24 MED ORDER — AMOXICILLIN-POT CLAVULANATE 875-125 MG PO TABS: 1.0000 | ORAL_TABLET | Freq: Two times a day (BID) | ORAL | 0 refills | Status: DC

## 2023-07-24 MED ORDER — FLUCONAZOLE 150 MG PO TABS: 150.0000 mg | ORAL_TABLET | Freq: Every day | ORAL | 0 refills | Status: AC

## 2023-07-24 MED ORDER — PREDNISONE 10 MG (21) PO TBPK: ORAL_TABLET | Freq: Every day | ORAL | 0 refills | Status: DC

## 2023-07-24 NOTE — Discharge Instructions (Signed)
 Today you are being treated for an infection of the eardrum  Take augmentin twice daily for 7 days, you should begin to see improvement after 48 hours of medication use and then it should progressively get better  Begin prednisone every morning with as directed to reduce sinus pressure  If needed you may take Diflucan if you start to have vaginal itching or irritation during antibiotic treatment, then take second tablet after completion of all medication  You may use Tylenol or ibuprofen for management of discomfort  May hold warm compresses to the ear for additional comfort  Please not attempted any ear cleaning or object or fluid placement into the ear canal to prevent further irritation    For cough: honey 1/2 to 1 teaspoon (you can dilute the honey in water or another fluid).  You can also use guaifenesin and dextromethorphan for cough. You can use a humidifier for chest congestion and cough.  If you don't have a humidifier, you can sit in the bathroom with the hot shower running.      For sore throat: try warm salt water gargles, cepacol lozenges, throat spray, warm tea or water with lemon/honey, popsicles or ice, or OTC cold relief medicine for throat discomfort.   For congestion: take a daily anti-histamine like Zyrtec, Claritin, and a oral decongestant, such as pseudoephedrine.  You can also use Flonase 1-2 sprays in each nostril daily.   It is important to stay hydrated: drink plenty of fluids (water, gatorade/powerade/pedialyte, juices, or teas) to keep your throat moisturized and help further relieve irritation/discomfort.

## 2023-07-24 NOTE — ED Triage Notes (Addendum)
 Patient presents to [facility] for evaluation of sore throat, fever, body aches, productive cough, and severe headache all weekend and into today.    Patient is currently on Macrobid for UTI.  Is concerned she may need other antibiotics for possible walking pneumonia or bronchitis

## 2023-07-24 NOTE — ED Provider Notes (Signed)
 Dominique Morris    CSN: [id number] Arrival date & time: 07/24/23  1611      History   Chief Complaint Chief Complaint  Patient presents with   Fever    HPI Dominique Morris is a 23 y.o. female.   [facility] for evaluation of chills, body aches, nasal congestion rhinorrhea, productive cough, sinus pressure across the forehead and severe headaches described as a migraine present for 4 days.  This.  No known sick contacts prior but does work in [employer].  Has attempted use of Robitussin, Vicks vapor rub and use of humidifier.   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 Authorizing Provider  amoxicillin-clavulanate (AUGMENTIN) 875-125 MG tablet Take 1 tablet by mouth every 12 (twelve) hours. 07/24/23  Yes Obert Espindola R, NP  fluconazole (DIFLUCAN) 150 MG tablet Take 1 tablet (150 mg total) by mouth daily for 2 doses. 07/24/23 07/26/23 Yes Tramar Brueckner,  Kwabena Strutz R, NP  predniSONE (STERAPRED UNI-PAK 21 TAB) 10 MG (21) TBPK tablet Take by mouth daily. Take 6 tabs by mouth daily  for 1 days, then 5 tabs for 1 days, then 4 tabs for 1 days, then 3 tabs for 1 days, 2 tabs for 1 days, then 1 tab by mouth daily for 1 days 07/24/23  Yes Kearie Mennen R, NP  ibuprofen (ADVIL) 600 MG tablet Take 1 tablet (600 mg total) by mouth every 6 (six) hours as needed. 02/21/23   Mickie Bail, NP  levothyroxine (SYNTHROID) 137 MCG tablet Take 137 mcg by mouth daily before breakfast.    [provider]  liothyronine (CYTOMEL) 5 MCG tablet Take by mouth. 10/13/20 11/13/21  [provider]  norgestimate-ethinyl estradiol (SPRINTEC 28) 0.25-35 MG-MCG tablet Take 1 tablet by mouth daily. 03/29/23   Hildred Laser, MD  phentermine (ADIPEX-P) 37.5 MG tablet Take 37.5 mg by mouth daily before breakfast. 03/14/23   [provider]  sulfamethoxazole-trimethoprim (BACTRIM DS) 800-160 MG tablet Take 1 tablet by mouth 2 (two) times daily. 04/03/23   Hildred Laser, MD    Family History Family History  Problem Relation Age of Onset   Endometriosis Mother    Cushing syndrome Mother    Hyperlipidemia Father  Endometriosis Maternal Aunt    Breast cancer Neg Hx    Ovarian cancer Neg Hx    Colon cancer Neg Hx     Social History Social History   Tobacco Use   Smoking status: Never   Smokeless tobacco: Never  Vaping Use   Vaping status: Never Used  Substance Use Topics   Alcohol use: Yes    Comment: socially   Drug use: No     Allergies   Latex, Shrimp (diagnostic), and Procyanidolic oligomers   Review of Systems Review of Systems   Physical Exam Triage Vital Signs ED Triage Vitals  Encounter Vitals Group     BP 07/24/23 1728 131/83     Systolic BP Percentile --      Diastolic BP Percentile --      Pulse Rate 07/24/23 1728 78     Resp 07/24/23 1728 18     Temp 07/24/23 1728 98.9 F (37.2 C)     Temp Source 07/24/23 1728 Oral      SpO2 07/24/23 1728 98 %     Weight --      Height --      Head Circumference --      Peak Flow --      Pain Score 07/24/23 1729 8     Pain Loc --      Pain Education --      Exclude from Growth Chart --    No data found.  Updated Vital Signs BP 131/83 (BP Location: Left Arm)   Pulse 78   Temp 98.9 F (37.2 C) (Oral)   Resp 18   LMP 07/01/2023   SpO2 98%   Visual Acuity Right Eye Distance:   Left Eye Distance:   Bilateral Distance:    Right Eye Near:   Left Eye Near:    Bilateral Near:     Physical Exam Constitutional:      Appearance: Normal appearance.  HENT:     Right Ear: Tympanic membrane is erythematous.     Left Ear: Tympanic membrane is erythematous.     Nose: Congestion present. No rhinorrhea.     Mouth/Throat:     Pharynx: No oropharyngeal exudate or posterior oropharyngeal erythema.  Eyes:     Extraocular Movements: Extraocular movements intact.  Cardiovascular:     Rate and Rhythm: Normal rate and regular rhythm.     Pulses: Normal pulses.     Heart sounds: Normal heart sounds.  Pulmonary:     Effort: Pulmonary effort is normal.     Breath sounds: Normal breath sounds.  Neurological:     Mental Status: She is alert and oriented to person, place, and time. Mental status is at baseline.      UC Treatments / Results  Labs (all labs ordered are listed, but only abnormal results are displayed) Labs Reviewed - No data to display  EKG   Radiology No results found.  Procedures Procedures (including critical care time)  Medications Ordered in UC Medications - No data to display  Initial Impression / Assessment and Plan / UC Course  I have reviewed the triage vital signs and the nursing notes.  Pertinent labs & imaging results that were available during my care of the patient were reviewed by me and considered in my medical decision making (see chart for details).   Nonrecurrent acute serous otitis media of both ears  Erythema to the  tympanic membrane is consistent with infection, congestion to the nasal turbinates otherwise stable  exam, prescribed Augmentin and prednisone, advised against ear cleaning, may use over-the-counter analgesics and warm compresses to the external ear for comfort, may follow-up if symptoms persist worsen or recur  Final Clinical Impressions(s) / UC Diagnoses   Final diagnoses:  Non-recurrent acute serous otitis media of both ears     Discharge Instructions      Today you are being treated for an infection of the eardrum  Take augmentin twice daily for 7 days, you should begin to see improvement after 48 hours of medication use and then it should progressively get better  Begin prednisone every morning with as directed to reduce sinus pressure  If needed you may take Diflucan if you start to have vaginal itching or irritation during antibiotic treatment, then take second tablet after completion of all medication  You may use Tylenol or ibuprofen for management of discomfort  May hold warm compresses to the ear for additional comfort  Please not attempted any ear cleaning or object or fluid placement into the ear canal to prevent further irritation    For cough: honey 1/2 to 1 teaspoon (you can dilute the honey in water or another fluid).  You can also use guaifenesin and dextromethorphan for cough. You can use a humidifier for chest congestion and cough.  If you don't have a humidifier, you can sit in the bathroom with the hot shower running.      For sore throat: try warm salt water gargles, cepacol lozenges, throat spray, warm tea or water with lemon/honey, popsicles or ice, or OTC cold relief medicine for throat discomfort.   For congestion: take a daily anti-histamine like Zyrtec, Claritin, and a oral decongestant, such as pseudoephedrine.  You can also use Flonase 1-2 sprays in each nostril daily.   It is important to stay hydrated: drink plenty of fluids (water,  gatorade/powerade/pedialyte, juices, or teas) to keep your throat moisturized and help further relieve irritation/discomfort.    ED Prescriptions     Medication Sig Dispense Auth. Provider   amoxicillin-clavulanate (AUGMENTIN) 875-125 MG tablet Take 1 tablet by mouth every 12 (twelve) hours. 14 tablet Kysean Sweet R, NP   fluconazole (DIFLUCAN) 150 MG tablet Take 1 tablet (150 mg total) by mouth daily for 2 doses. 2 tablet Giannie Soliday R, NP   predniSONE (STERAPRED UNI-PAK 21 TAB) 10 MG (21) TBPK tablet Take by mouth daily. Take 6 tabs by mouth daily  for 1 days, then 5 tabs for 1 days, then 4 tabs for 1 days, then 3 tabs for 1 days, 2 tabs for 1 days, then 1 tab by mouth daily for 1 days 21 tablet Isra Lindy, Elita Boone, NP      PDMP not reviewed this encounter.   Valinda Hoar, [place] 07/24/23 [phone]

## 2023-07-25 ENCOUNTER — Ambulatory Visit: Payer: BC Managed Care – PPO | Admitting: Obstetrics and Gynecology

## 2023-07-31 ENCOUNTER — Ambulatory Visit: Payer: BC Managed Care – PPO | Admitting: Obstetrics and Gynecology

## 2023-08-01 ENCOUNTER — Telehealth: Payer: Self-pay

## 2023-08-01 ENCOUNTER — Ambulatory Visit: Payer: BC Managed Care – PPO | Admitting: Obstetrics and Gynecology

## 2023-08-01 NOTE — Telephone Encounter (Signed)
 Attempt x2 to contact the patient for rescheduling. No answer. Left message for the patient to contact our office for rescheduling.

## 2023-08-28 NOTE — Progress Notes (Unsigned)
     GYNECOLOGY OFFICE PROCEDURE NOTE  Dominique Morris is a 23 y.o. G0P0000 here for Mirena IUD insertion. No GYN concerns.  Last pap smear was on 03/15/2021 and was normal.  IUD Insertion Procedure Note Patient identified, informed consent performed, consent signed.   Discussed risks of irregular bleeding, cramping, infection, malpositioning or misplacement of the IUD outside the uterus which may require further procedure such as laparoscopy. Also discussed >99% contraception efficacy, increased risk of ectopic pregnancy with failure of method.   Emphasized that this did not protect against STIs, condoms recommended during all sexual encounters. Time out was performed.  Urine pregnancy test negative.  Speculum placed in the vagina.  Cervix visualized.  Cleaned with Betadine x 2.  Grasped anteriorly with a single tooth tenaculum.  Uterus sounded to *** cm.  *** IUD placed per manufacturer's recommendations.  Strings trimmed to 3 cm. Tenaculum was removed, good hemostasis noted.  Patient tolerated procedure well.   Patient was given post-procedure instructions.  She was advised to have backup contraception for one week.  Patient was also asked to check IUD strings periodically and follow up in 4 weeks for IUD check.    Hildred Laser, MD [facility]

## 2023-08-28 NOTE — Patient Instructions (Signed)
 IUD PLACEMENT POST-PROCEDURE INSTRUCTIONS  You may take Ibuprofen, Aleve or Tylenol for pain if needed.  Cramping should resolve within in 24 hours.  You may have a small amount of spotting.  You should wear a mini pad for the next few days.  You may have intercourse after 24 hours.  If you using this for birth control, it is effective immediately.  You need to call if you have any pelvic pain, fever, heavy bleeding or foul smelling vaginal discharge.  Irregular bleeding is common the first several months after having an IUD placed. You do not need to call for this reason unless you are concerned.  Shower or bathe as normal  You should have a follow-up appointment in 4-8 weeks for a re-check to make sure you are not having any problems.    Hormonal Birth Control (Hormonal Contraception): What to Know Hormonal birth control, also called hormonal contraception, is a type of birth control that uses chemicals called hormones to prevent pregnancy. It may include a combination of the hormones estrogen and progesterone, or only the hormone progesterone. Hormonal birth control works in these ways: It thickens the mucus in the cervix, which is the lowest part of the uterus. Thicker mucus makes it harder for sperm to get into the uterus. It changes the lining of the uterus. This makes it harder for an egg to attach or implant. It may stop the ovaries from releasing eggs, called ovulation. Some people who take hormonal birth control that contains only progesterone may continue to ovulate. Hormonal birth control doesn't protect against sexually transmitted infections (STIs). Pregnancy may still happen. Types of hormonal birth control  Estrogen and progesterone birth control Birth control that use a combination of estrogen and progesterone is available as: Pills that come in different combinations of hormones. Pills must be taken at the same time each day. They can affect your period. You can get your  period monthly, once every 3 months, or not at all. A patch that is applied to the butt, belly, upper outer arm, or back. It's kept in place for 3 weeks. It's taken off for the last or fourth week of the menstrual cycle. A vaginal ring. The ring is placed in the vagina and left there for 3 weeks. It's then taken off for the last or fourth week of the cycle. Progesterone-only birth control Birth control that uses only progesterone is available as: Pills. These should be taken at the same time every day. This is very important to decrease the chance of pregnancy. Pills containing progestin-only are usually taken every day of the cycle. Other types of pills may have an inactive pill for the last 4 days of every cycle. Intrauterine device (IUD). This device is inserted through the vagina and cervix into the uterus. It's taken out or replaced every 3 to 8 years, depending on the type. It can be taken out sooner. Implant. A plastic rod is placed under the skin of the upper arm. It is taken out or replaced every 3 years. It can be taken out sooner. Shot, also called injection. The shot is given once every 12 to 14 weeks. Risks associated with hormonal birth control Estrogen and progesterone birth control can sometimes cause side effects, such as: Feeling like you may throw up. Headaches. Breast tenderness. Bleeding or spotting between menstrual cycles. High blood pressure. This is rare. Strokes, heart attacks, or blood clots. These are rare. Progesterone-only birth control can sometimes have side effects, such as: Feeling  like you may throw up. Headaches. Breast tenderness. Irregular menstrual bleeding. High blood pressure. This is rare. Talk to your health care provider about what side effects may mean for you. Questions to ask: What type of hormonal birth control is right for me? How long should I plan to use hormonal birth control? What are the side effects of the hormonal birth control method  I choose? How can I prevent STIs while using hormonal birth control? Where to find more information Ask your provider for more information and resources about hormonal birth control. You can also go to: U.[place]. Department of Health and [employer], Office on Women's Health: http://hoffman.com/ This information is not intended to replace advice given to you by your health care provider. Make sure you discuss any questions you have with your health care provider. Document Revised: 11/27/2022 Document Reviewed: 11/27/2022 Elsevier Patient Education  2024 [employer].

## 2023-08-29 ENCOUNTER — Ambulatory Visit (INDEPENDENT_AMBULATORY_CARE_PROVIDER_SITE_OTHER): Admitting: Obstetrics and Gynecology

## 2023-08-29 ENCOUNTER — Encounter: Payer: Self-pay | Admitting: Obstetrics and Gynecology

## 2023-08-29 VITALS — BP 124/79 | HR 88 | Resp 16 | Ht 69.0 in | Wt 279.0 lb

## 2023-08-29 DIAGNOSIS — Z30431 Encounter for routine checking of intrauterine contraceptive device: Secondary | ICD-10-CM | POA: Diagnosis not present

## 2023-08-29 DIAGNOSIS — Z538 Procedure and treatment not carried out for other reasons: Secondary | ICD-10-CM

## 2023-08-29 DIAGNOSIS — Z30430 Encounter for insertion of intrauterine contraceptive device: Secondary | ICD-10-CM

## 2023-08-29 MED ORDER — LEVONORGESTREL 19.5 MG IU IUD
INTRAUTERINE_SYSTEM | Freq: Once | INTRAUTERINE | Status: DC
Start: 2023-08-29 — End: 2023-08-29

## 2023-08-29 MED ORDER — LEVONORGESTREL 20 MCG/DAY IU IUD
1.0000 | INTRAUTERINE_SYSTEM | Freq: Once | INTRAUTERINE | Status: AC
Start: 2023-08-29 — End: 2023-08-29
  Administered 2023-08-29: 1 via INTRAUTERINE

## 2023-09-10 ENCOUNTER — Telehealth: Payer: Self-pay

## 2023-09-10 NOTE — Telephone Encounter (Signed)
 TRIAGE VOICEMAIL: Patient states she has an appt 09/12/23 with Dr. Denman Fischer for IUD placement. She came 1.5 weeks ago and tried to have it placed, but it was unsuccessful. She r/s to when her period was due, but her period came early and she is no longer on it. She was advised to request a rx to help her cervix dilate.

## 2023-09-11 MED ORDER — MISOPROSTOL 200 MCG PO TABS: ORAL_TABLET | ORAL | 0 refills | Status: DC

## 2023-09-11 NOTE — Telephone Encounter (Signed)
 Prescription sent

## 2023-09-11 NOTE — Telephone Encounter (Signed)
 Patient aware.

## 2023-09-12 ENCOUNTER — Ambulatory Visit: Admitting: Obstetrics and Gynecology

## 2023-09-12 ENCOUNTER — Encounter: Payer: Self-pay | Admitting: Obstetrics and Gynecology

## 2023-09-12 VITALS — BP 128/51 | HR 89 | Resp 16 | Ht 69.0 in | Wt 283.0 lb

## 2023-09-12 DIAGNOSIS — N882 Stricture and stenosis of cervix uteri: Secondary | ICD-10-CM

## 2023-09-12 DIAGNOSIS — Z30430 Encounter for insertion of intrauterine contraceptive device: Secondary | ICD-10-CM

## 2023-09-12 MED ORDER — LEVONORGESTREL 20 MCG/DAY IU IUD
1.0000 | INTRAUTERINE_SYSTEM | Freq: Once | INTRAUTERINE | Status: AC
Start: 2023-09-12 — End: 2023-09-12
  Administered 2023-09-12: 1 via INTRAUTERINE

## 2023-09-12 NOTE — Patient Instructions (Signed)
 IUD PLACEMENT POST-PROCEDURE INSTRUCTIONS  You may take Ibuprofen, Aleve or Tylenol for pain if needed.  Cramping should resolve within in 24 hours.  You may have a small amount of spotting.  You should wear a mini pad for the next few days.  You may have intercourse after 24 hours.  If you using this for birth control, it is effective immediately.  You need to call if you have any pelvic pain, fever, heavy bleeding or foul smelling vaginal discharge.  Irregular bleeding is common the first several months after having an IUD placed. You do not need to call for this reason unless you are concerned.  Shower or bathe as normal  You should have a follow-up appointment in 4-8 weeks for a re-check to make sure you are not having any problems.    Hormonal Birth Control (Hormonal Contraception): What to Know Hormonal birth control, also called hormonal contraception, is a type of birth control that uses chemicals called hormones to prevent pregnancy. It may include a combination of the hormones estrogen and progesterone, or only the hormone progesterone. Hormonal birth control works in these ways: It thickens the mucus in the cervix, which is the lowest part of the uterus. Thicker mucus makes it harder for sperm to get into the uterus. It changes the lining of the uterus. This makes it harder for an egg to attach or implant. It may stop the ovaries from releasing eggs, called ovulation. Some people who take hormonal birth control that contains only progesterone may continue to ovulate. Hormonal birth control doesn't protect against sexually transmitted infections (STIs). Pregnancy may still happen. Types of hormonal birth control  Estrogen and progesterone birth control Birth control that use a combination of estrogen and progesterone is available as: Pills that come in different combinations of hormones. Pills must be taken at the same time each day. They can affect your period. You can get your  period monthly, once every 3 months, or not at all. A patch that is applied to the butt, belly, upper outer arm, or back. It's kept in place for 3 weeks. It's taken off for the last or fourth week of the menstrual cycle. A vaginal ring. The ring is placed in the vagina and left there for 3 weeks. It's then taken off for the last or fourth week of the cycle. Progesterone-only birth control Birth control that uses only progesterone is available as: Pills. These should be taken at the same time every day. This is very important to decrease the chance of pregnancy. Pills containing progestin-only are usually taken every day of the cycle. Other types of pills may have an inactive pill for the last 4 days of every cycle. Intrauterine device (IUD). This device is inserted through the vagina and cervix into the uterus. It's taken out or replaced every 3 to 8 years, depending on the type. It can be taken out sooner. Implant. A plastic rod is placed under the skin of the upper arm. It is taken out or replaced every 3 years. It can be taken out sooner. Shot, also called injection. The shot is given once every 12 to 14 weeks. Risks associated with hormonal birth control Estrogen and progesterone birth control can sometimes cause side effects, such as: Feeling like you may throw up. Headaches. Breast tenderness. Bleeding or spotting between menstrual cycles. High blood pressure. This is rare. Strokes, heart attacks, or blood clots. These are rare. Progesterone-only birth control can sometimes have side effects, such as: Feeling  like you may throw up. Headaches. Breast tenderness. Irregular menstrual bleeding. High blood pressure. This is rare. Talk to your health care provider about what side effects may mean for you. Questions to ask: What type of hormonal birth control is right for me? How long should I plan to use hormonal birth control? What are the side effects of the hormonal birth control method  I choose? How can I prevent STIs while using hormonal birth control? Where to find more information Ask your provider for more information and resources about hormonal birth control. You can also go to: U.[place]. Department of Health and [employer], Office on Women's Health: http://hoffman.com/ This information is not intended to replace advice given to you by your health care provider. Make sure you discuss any questions you have with your health care provider. Document Revised: 11/27/2022 Document Reviewed: 11/27/2022 Elsevier Patient Education  2024 [employer].

## 2023-09-12 NOTE — Progress Notes (Signed)
     GYNECOLOGY OFFICE PROCEDURE NOTE  Dominique Morris is a 23 y.o. G0P0000 here for Mirena IUD insertion. This is her second attempt in office.  Last attempt failed due to tortuous endocervical canal with stenosis. Prescribed pre-procedure Cytotec for this procedure but patient notes that she did not know that it was available at her pharmacy so did not take.   Last pap smear was on 03/15/2021 and was normal.  Patient's last menstrual period was 09/04/2023 (exact date).   IUD Insertion Procedure Note Patient identified, informed consent performed, consent signed.   Discussed risks of irregular bleeding, cramping, infection, malpositioning or misplacement of the IUD outside the uterus which may require further procedure such as laparoscopy. Also discussed >99% contraception efficacy, increased risk of ectopic pregnancy with failure of method.   Emphasized that this did not protect against STIs, condoms recommended during all sexual encounters. Time out was performed.  Urine pregnancy test negative.  Speculum placed in the vagina.  Cervix visualized.  Cleaned with Betadine x 2.  Grasped anteriorly with a single tooth tenaculum. Cervix was injected circumferentially using 8 ml of 1% lidocaine for analgesia.  The cervix was then dilated using cervical dilators until a sound could be passed.  The endocervical canal was again noted to be tortuous.  Uterus sounded to 7 cm.  The IUD applicator was then bent upward at a 45 degree angle and was able to be placed per manufacturer's recommendations.  Strings trimmed to 3 cm. Tenaculum was removed, good hemostasis noted.  Patient tolerated procedure well.   Patient was given post-procedure instructions.  She was advised to have backup contraception for one week.  Patient was also asked to check IUD strings periodically and follow up in 4 weeks for IUD check.   Lot: [id number] Exp: 09/2025   Teresa Fender, MD [facility]

## 2023-10-11 ENCOUNTER — Ambulatory Visit

## 2023-10-11 VITALS — BP 132/80 | HR 83 | Ht 69.0 in | Wt 278.8 lb

## 2023-10-11 DIAGNOSIS — Z30431 Encounter for routine checking of intrauterine contraceptive device: Secondary | ICD-10-CM

## 2023-10-11 NOTE — Progress Notes (Signed)
   GYN ENCOUNTER  Encounter for IUD check  Subjective  HPI: Dominique Morris is a 23 y.o. G0P0000 who presents today for IUD check.   Mirena  IUD placed 09/12/23. Has had some mild cramping and bleeding in the past month sine placement.  No  other concerns.  Past Medical History:  Diagnosis Date   Dysmenorrhea    PCOS (polycystic ovarian syndrome)    Thyroid  disease    Past Surgical History:  Procedure Laterality Date   OSTEOCHONDROMA EXCISION  02/2014   WISDOM TOOTH EXTRACTION  08/15/2019   OB History     Gravida  0   Para  0   Term  0   Preterm  0   AB  0   Living  0      SAB  0   IAB  0   Ectopic  0   Multiple  0   Live Births  0          Allergies  Allergen Reactions   Latex     Other reaction(s): Other (See Comments) Other Reaction: Sesitivity   Shrimp (Diagnostic)    Procyanidolic Oligomers Itching and Rash    Review of Systems  12 point ROS negative except for pertinent positives noted in HPO above.   Objective  BP 132/80   Pulse 83   Ht 5\' 9"  (1.753 m)   Wt 278 lb 12.8 oz (126.5 kg)   LMP 09/04/2023 (Exact Date)   BMI 41.17 kg/m   Physical examination GENERAL APPEARANCE: alert, well appearing LUNGS: normal work of breathing HEART: normal heart rate Pelvic exam: normal external genitalia, vulva, vagina, cervix, uterus and adnexa,  VAGINA: normal appearing vagina with normal color and discharge, no lesions,  CERVIX: IUD strings visualized at cervical os, normal appearing cervix without discharge or lesions.   Assessment/Plan -IUD strings visualized, IUD appears to be in place.  - Reviewed normal irregular bleeding patterns over the first 3-5 months after Mirena  placement. Provided reassurance for normal symptoms. - Follow up as needed.    Verita Glassman Kaj Vasil, CNM  10/11/23 2:20 PM

## 2023-11-05 ENCOUNTER — Ambulatory Visit (LOCAL_COMMUNITY_HEALTH_CENTER): Payer: Self-pay

## 2023-11-05 ENCOUNTER — Other Ambulatory Visit: Payer: Self-pay

## 2023-11-05 DIAGNOSIS — Z111 Encounter for screening for respiratory tuberculosis: Secondary | ICD-10-CM

## 2023-11-07 ENCOUNTER — Ambulatory Visit (LOCAL_COMMUNITY_HEALTH_CENTER)

## 2023-11-07 DIAGNOSIS — Z111 Encounter for screening for respiratory tuberculosis: Secondary | ICD-10-CM

## 2023-11-07 LAB — TB SKIN TEST
Induration: 0 mm
TB Skin Test: NEGATIVE

## 2024-02-29 ENCOUNTER — Emergency Department
Admission: EM | Admit: 2024-02-29 | Discharge: 2024-02-29 | Disposition: A | Discharge status: Home / Self Care | Attending: Emergency Medicine | Admitting: Emergency Medicine

## 2024-02-29 ENCOUNTER — Emergency Department

## 2024-02-29 ENCOUNTER — Ambulatory Visit: Admission: EM | Admit: 2024-02-29 | Discharge: 2024-02-29 | Disposition: A | Discharge status: Home / Self Care

## 2024-02-29 ENCOUNTER — Other Ambulatory Visit: Payer: Self-pay

## 2024-02-29 DIAGNOSIS — R2242 Localized swelling, mass and lump, left lower limb: Secondary | ICD-10-CM

## 2024-02-29 DIAGNOSIS — L52 Erythema nodosum: Secondary | ICD-10-CM | POA: Diagnosis not present

## 2024-02-29 DIAGNOSIS — M79605 Pain in left leg: Secondary | ICD-10-CM | POA: Diagnosis present

## 2024-02-29 LAB — RESP PANEL BY RT-PCR (RSV, FLU A&B, COVID)  RVPGX2
Influenza A by PCR: NEGATIVE
Influenza B by PCR: NEGATIVE
Resp Syncytial Virus by PCR: NEGATIVE
SARS Coronavirus 2 by RT PCR: NEGATIVE

## 2024-02-29 MED ORDER — PREDNISONE 20 MG PO TABS
60.0000 mg | ORAL_TABLET | Freq: Once | ORAL | Status: AC
  Administered 2024-02-29: 60 mg via ORAL
  Filled 2024-02-29: qty 3

## 2024-02-29 MED ORDER — PREDNISONE 50 MG PO TABS: 50.0000 mg | ORAL_TABLET | Freq: Every day | ORAL | 0 refills | Status: AC

## 2024-02-29 NOTE — ED Provider Notes (Signed)
 [facility] Provider Note    Event Date/Time   First MD Initiated Contact with Patient 02/29/24 2016     (approximate)   History   Leg Pain    HPI  Dominique Morris is a 23 y.o. female    with a past medical history of strain, Hashimoto, who presents to the ED complaining of left leg pain.  According to the patient symptoms started a week ago with left leg edema, nodular lesions on her left lower leg, decreased energy, bilateral leg pain.  Patient describes family history of erythema nodosum.  Patient denies recent traveling history, is not taking oral contraceptives.    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    ROS: Patient currently denies any vision changes, tinnitus, difficulty speaking, facial droop, sore throat, chest pain, shortness of breath, abdominal pain, nausea/vomiting/diarrhea, dysuria, or weakness/numbness/paresthesias in any extremity   Physical Exam   Triage Vital Signs: ED Triage Vitals  Encounter Vitals Group     BP 02/29/24 1926 (!) 154/87     Girls Systolic BP Percentile --      Girls Diastolic BP Percentile --      Boys Systolic BP Percentile --      Boys Diastolic BP Percentile --      Pulse Rate 02/29/24 1926 83     Resp 02/29/24 1926 18     Temp 02/29/24 1926 98.4 F (36.9 C)     Temp Source 02/29/24 1926 Oral     SpO2 02/29/24 1926 100 %     Weight 02/29/24 1927 280 lb (127 kg)     Height 02/29/24 1927 5' 9  (1.753 m)     Head Circumference --      Peak Flow --      Pain Score 02/29/24 1926 7     Pain Loc --      Pain Education --      Exclude from Growth Chart --     Most recent vital signs: Vitals:   02/29/24 1926  BP: (!) 154/87  Pulse: 83  Resp: 18  Temp: 98.4 F (36.9 C)  SpO2: 100%     Physical Exam Vitals and nursing note reviewed.   General:          Awake, no distress.  CV:                  Good peripheral perfusion.  Resp:               Normal effort. no tachypnea Abd:                 No distention.  Soft nontender Other:              Left leg: Calf; skin is intact, evidence of nodular lesions in the medial anterior third.  Tenderness to palpate.  Edema grade 2.  ED Results / Procedures / Treatments   Labs (all labs ordered are listed, but only abnormal results are displayed) Labs Reviewed  RESP PANEL BY  RT-PCR (RSV, FLU A&B, COVID)  [id number]      RADIOLOGY I independently reviewed and interpreted imaging and agree with radiologists findings.      PROCEDURES:  Critical Care performed:   Procedures   MEDICATIONS ORDERED IN ED: Medications  predniSONE  (DELTASONE ) tablet 60 mg (has no administration in time range)   Clinical Course as of 02/29/24 2221  Fri Feb 29, 2024  2054 Resp panel by RT-PCR (RSV, Flu A&B, Covid) Anterior Nasal Swab Negative [AE]  2213 US  Venous Img Lower Unilateral Left No evidence of DVT. 2. Scattered areas of asymmetric subcutaneous edema within the lateral aspect of the left lower extremity. A discrete loculated subcutaneous fluid collection, however, is not identified on the presented images    [AE]    Clinical Course User Index [AE] Janit Kast, PA-C    IMPRESSION / MDM / ASSESSMENT AND PLAN / ED COURSE  I reviewed the triage vital signs and the nursing notes.  Differential diagnosis includes, but is not limited to, erythema nodosum, DVT, cellulitis  Patient's presentation is most consistent with acute  complicated illness / injury requiring diagnostic workup.   Dominique Morris is a 23 y.o., female with history of left lower leg edema, pain, and nodules.  Physical exam skin is intact, presence of 2 nodules in the lateral and medial area of the middle third of the left leg.  No signs of infection.  Sensation is intact.  Pulses positive.  Rest of physical exam was normal. Plan Left leg ultrasound Reassess Ultrasound rule out DVT and detected the presence of the lesions on her legs without signs of infection. 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 I independently reviewed and interpreted imaging and agree with radiologists findings. Labs are  reassuring ruling out COVID, RSV, influenza. I did review the patient's allergies and medications.The patient is in stable and satisfactory condition for discharge home  Patient will be discharged home with prescriptions for prednisone . Patient is to follow up with [facility] as needed or otherwise directed. Patient is given ED precautions to return to the ED for any worsening or new symptoms.  Patient does not have a primary care physician and I will refer her to general clinic to establish care and continue follow-up for her erythema nodosum.  Work note was provided Discussed plan of care with patient, answered all of patient's questions, Patient agreeable to plan of care. Advised patient to take medications according to the instructions on the label. Discussed possible side effects of new medications. Patient verbalized understanding.  FINAL CLINICAL IMPRESSION(S) / ED DIAGNOSES   Final diagnoses:  Erythema nodosum     Rx / DC Orders   ED Discharge Orders          Ordered    predniSONE  (DELTASONE ) 50 MG tablet  Daily with breakfast        02/29/24 2220             Note:  This document was prepared using Dragon voice recognition software and may include unintentional dictation errors.   Janit Kast, PA-C 02/29/24  2221    Floy Roberts, MD 02/29/24 2223

## 2024-02-29 NOTE — Discharge Instructions (Signed)
 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  This take prednisone  1 tablet with breakfast.  Please call [facility] to make an appointment to establish care and continue follow-up.  Please come back to ED or go to your PCP if you have new symptoms or symptoms worsen.

## 2024-02-29 NOTE — Discharge Instructions (Signed)
 Go to the emergency department for evaluation of the 2 knots in your left lower leg.

## 2024-02-29 NOTE — ED Provider Notes (Signed)
 Dominique Morris    CSN: [id number] Arrival date & time: 02/29/24  1819      History   Chief Complaint Chief Complaint  Patient presents with   Leg Pain    HPI Dominique Morris is a 23 y.o. female.  Patient presents with 2 painful knots on her left lower leg x 2 weeks.  The knots are on the anterior side of her left lower leg with tenderness and localized redness.  No trauma.  Patient reports generalized bodyaches and fatigue also.  No numbness, weakness, open wounds, chest pain, shortness of breath, fever, chills.  No OTC medications taken today.  Patient states her mother had similar areas on her lower leg which were diagnosed as erythema nodosum which improved with doxycycline  and prednisone  taper.  The history is provided by the patient and medical records.    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 Authorizing Provider  levothyroxine   (SYNTHROID ) 137 MCG tablet Take 137 mcg by mouth daily before breakfast.   Yes [provider]  ibuprofen  (ADVIL ) 600 MG tablet Take 1 tablet (600 mg total) by mouth every 6 (six) hours as needed. 02/21/23   Corlis Burnard DEL, NP  liothyronine (CYTOMEL) 5 MCG tablet Take by mouth. 10/13/20 11/13/21  [provider]  phentermine (ADIPEX-P) 37.5 MG tablet Take 37.5 mg by mouth daily before breakfast. 03/14/23   [provider]    Family History Family History  Problem Relation Age of Onset   Endometriosis Mother    Cushing syndrome Mother    Hyperlipidemia Father    Endometriosis Maternal Aunt    Breast cancer Neg Hx    Ovarian cancer Neg Hx    Colon cancer Neg Hx     Social History Social History   Tobacco Use   Smoking status: Never   Smokeless tobacco: Never  Vaping Use   Vaping status: Never Used  Substance Use Topics   Alcohol use: Yes    Comment: socially   Drug use: No     Allergies   Latex, Shrimp (diagnostic), and Procyanidolic oligomers   Review of Systems Review of Systems  Constitutional:  Negative for chills and fever.  Musculoskeletal:  Negative for arthralgias  and joint swelling.  Skin:  Positive for color change. Negative for wound.  Neurological:  Negative for weakness and numbness.     Physical Exam Triage Vital Signs ED Triage Vitals  Encounter Vitals Group     BP 02/29/24 1827 123/88     Girls Systolic BP Percentile --      Girls Diastolic BP Percentile --      Boys Systolic BP Percentile --      Boys Diastolic BP Percentile --      Pulse Rate 02/29/24 1827 82     Resp 02/29/24 1827 18     Temp 02/29/24 1827 98 F (36.7 C)     Temp src --      SpO2 02/29/24 1827 98 %     Weight --      Height --      Head Circumference --      Peak Flow --      Pain Score 02/29/24 1833 6     Pain Loc --      Pain Education --      Exclude from Growth Chart --    No data found.  Updated Vital Signs BP 123/88   Pulse 82    Temp 98 F (36.7 C)   Resp 18   LMP 02/18/2024 (Approximate)   SpO2 98%   Visual Acuity Right Eye Distance:   Left Eye Distance:   Bilateral Distance:    Right Eye Near:   Left Eye Near:    Bilateral Near:     Physical Exam Constitutional:      General: She is not in acute distress. HENT:     Mouth/Throat:     Mouth: Mucous membranes are moist.  Cardiovascular:     Rate and Rhythm: Normal rate and regular rhythm.  Pulmonary:     Effort: Pulmonary effort is normal. No respiratory distress.  Musculoskeletal:        General: No deformity. Normal range of motion.       Legs:  Skin:    General: Skin is warm and dry.     Capillary Refill: Capillary refill takes less than 2 seconds.     Findings: Erythema present.  Neurological:     Mental Status: She is alert.     Sensory: No sensory deficit.     Motor: No weakness.     Gait: Gait normal.      UC Treatments / Results  Labs (all labs ordered are listed, but only abnormal results are displayed) Labs Reviewed - No data to display  EKG   Radiology No results found.  Procedures Procedures (including critical care time)  Medications Ordered in UC Medications - No data to display  Initial Impression / Assessment and Plan / UC Course  I have reviewed the triage vital signs and the nursing notes.  Pertinent labs & imaging results that were available during my care of the patient were reviewed by me and considered in my medical decision making (see chart for details).    Mass of left lower leg.  Afebrile and vital signs are stable.  No chest pain or shortness of breath.  Discussed limitations of evaluation of her symptoms in an urgent care setting given that it is Friday evening and no ultrasound available to rule out DVT.  Sending patient to the ED for evaluation of the 2 areas of concern in her lower leg.  She is agreeable to this but states she may wait until tomorrow  morning to go to the ED.  Final Clinical  Impressions(s) / UC Diagnoses   Final diagnoses:  Mass of left lower leg     Discharge Instructions      Go to the emergency department for evaluation of the 2 knots in your left lower leg.     ED Prescriptions   None    PDMP not reviewed this encounter.   Corlis Burnard DEL, NP 02/29/24 [phone]

## 2024-02-29 NOTE — ED Triage Notes (Signed)
 Patient states her left leg calf has knots that have been causing her body aches and pain. Denies any injury to her leg.

## 2024-02-29 NOTE — ED Triage Notes (Addendum)
 Pt presented to ED POV with c/o left lower leg pain x 2 weeks. States has taken ibuprofen  for pain without relief. Pt states she has noticed two knots on lower leg, one to front of shin and one to back of leg. Pt also endorses generalized body aches x 2 weeks as well. Denies injury to leg.   Was sent by UC to rule out blood clot.

## 2024-03-21 ENCOUNTER — Encounter: Admitting: Nurse Practitioner

## 2024-04-10 ENCOUNTER — Encounter: Admitting: Nurse Practitioner

## 2024-06-05 ENCOUNTER — Ambulatory Visit: Admitting: Nurse Practitioner

## 2024-06-05 ENCOUNTER — Encounter: Payer: Self-pay | Admitting: Nurse Practitioner

## 2024-06-05 VITALS — BP 126/88 | HR 101 | Temp 98.0°F | Resp 16 | Ht 69.0 in | Wt 292.8 lb

## 2024-06-05 DIAGNOSIS — L52 Erythema nodosum: Secondary | ICD-10-CM | POA: Diagnosis not present

## 2024-06-05 DIAGNOSIS — J029 Acute pharyngitis, unspecified: Secondary | ICD-10-CM

## 2024-06-05 DIAGNOSIS — J351 Hypertrophy of tonsils: Secondary | ICD-10-CM | POA: Diagnosis not present

## 2024-06-05 DIAGNOSIS — N809 Endometriosis, unspecified: Secondary | ICD-10-CM | POA: Diagnosis not present

## 2024-06-05 DIAGNOSIS — E782 Mixed hyperlipidemia: Secondary | ICD-10-CM

## 2024-06-05 DIAGNOSIS — E538 Deficiency of other specified B group vitamins: Secondary | ICD-10-CM | POA: Diagnosis not present

## 2024-06-05 DIAGNOSIS — E559 Vitamin D deficiency, unspecified: Secondary | ICD-10-CM

## 2024-06-05 DIAGNOSIS — E063 Autoimmune thyroiditis: Secondary | ICD-10-CM | POA: Diagnosis not present

## 2024-06-05 LAB — POCT RAPID STREP A (OFFICE): Rapid Strep A Screen: NEGATIVE

## 2024-06-05 MED ORDER — AMOXICILLIN-POT CLAVULANATE 875-125 MG PO TABS: 1.0000 | ORAL_TABLET | Freq: Two times a day (BID) | ORAL | 0 refills | Status: AC

## 2024-06-05 NOTE — Progress Notes (Signed)
 [facility] [address] [place], [place] [address]  Internal MEDICINE  Office Visit Note  Patient Name: Dominique Morris  [id number]  [id number]  Date of Service: 06/05/2024   Complaints/HPI Pt is here for establishment of PCP. Chief Complaint  Patient presents with   New Patient (Initial Visit)    HPI [name] presents for a new patient visit to establish care.  Well-appearing 24 y.o. female with hashimoto's thyroiditis, endometriosis, PCOS, obesity and sleep problems.  Her family history is significant for endometriosis, lupus, high cholesterol, cushing syndrome and thyroid  disease.  Work: works at [employer] since July last year.  Home: lives at home with family.  Diet: fair Exercise: sometimes  Tobacco use: none  Alcohol use: socially, not regularly Illicit drug use: -- consider UDS if significant use or concerning behavior Pap smear: goes to OBGYN which will do her pap at her upcoming well woman exam. Labs: overdue for labs and need additional for further evaluation  New or worsening pain: chronic joint pains, left leg swelling and the nodules are sore.  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 Sore throat started recently. She also has a significant family history of autoimmune disorders so sarcoid should be ruled out. Her mother has it too so it may be an idiopathic problem which can last for a couple of months. If it is caused by something else, the underlying problem will need to be addressed.    Current Medication: Outpatient Encounter Medications as of 06/05/2024  Medication Sig   amoxicillin -clavulanate (AUGMENTIN ) 875-125 MG tablet Take 1 tablet by mouth 2 (two) times daily for 10 days.   ibuprofen  (ADVIL ) 600 MG tablet Take 1 tablet (600 mg total) by mouth every 6 (six) hours as needed. (Patient not taking: Reported on 06/05/2024)   levothyroxine  (SYNTHROID ) 137 MCG tablet Take 137 mcg by mouth daily before breakfast.   liothyronine (CYTOMEL) 5 MCG tablet  Take by mouth.   phentermine (ADIPEX-P) 37.5 MG tablet Take 37.5 mg by mouth daily before breakfast. (Patient not taking: Reported on 06/05/2024)   No facility-administered encounter medications on file as of 06/05/2024.    Surgical History: Past Surgical History:  Procedure Laterality Date   OSTEOCHONDROMA EXCISION  02/2014   WISDOM TOOTH EXTRACTION  08/15/2019    Medical History: Past Medical History:  Diagnosis Date   Dysmenorrhea    PCOS (polycystic ovarian syndrome)    Thyroid  disease     Family History: Family History  Problem Relation Age of Onset   Endometriosis Mother    Cushing syndrome Mother    Hyperlipidemia Father    Endometriosis Maternal Aunt    Breast cancer Neg Hx    Ovarian cancer Neg Hx    Colon cancer Neg Hx     Social History   Socioeconomic History   Marital status: Single    Spouse name: Not on file   Number of children: Not on file   Years of education: Not on file   Highest education level: Not on file  Occupational History   Not on file  Tobacco Use   Smoking status: Never   Smokeless tobacco: Never  Vaping Use   Vaping status: Never Used  Substance and Sexual Activity   Alcohol use: Yes    Comment: socially   Drug use: No   Sexual activity: Yes    Birth control/protection: Pill, Condom  Other Topics Concern   Not on file  Social History Narrative   Not on file   Social  Drivers of Health   Tobacco Use: Low Risk (06/05/2024)   Patient History    Smoking Tobacco Use: Never    Smokeless Tobacco Use: Never    Passive Exposure: Not on file  Financial Resource Strain: Not on file  Food Insecurity: Not on file  Transportation Needs: Not on file  Physical Activity: Not on file  Stress: Not on file  Social Connections: Not on file  Intimate Partner Violence: Not on file  Depression (PHQ2-9): Low Risk (06/05/2024)   Depression (PHQ2-9)    PHQ-2 Score: 0  Alcohol Screen: Not on file  Housing: Not on file  Utilities: Not on file   Health Literacy: Not on file     Review of Systems  Constitutional:  Positive for fatigue and unexpected weight change. Negative for chills.  HENT:  Positive for postnasal drip, sore throat and trouble swallowing. Negative for congestion, rhinorrhea and sneezing.   Eyes:  Negative for redness.  Respiratory: Negative.  Negative for cough, chest tightness, shortness of breath and wheezing.   Cardiovascular:  Negative for chest pain and palpitations.  Gastrointestinal:  Negative for abdominal pain, constipation, diarrhea, nausea and vomiting.  Endocrine: Positive for heat intolerance.  Genitourinary:  Negative for dysuria and frequency.  Musculoskeletal:  Positive for arthralgias and myalgias. Negative for back pain, joint swelling and neck pain.       Nodules on left leg that are sore.  Skin:  Negative for rash.  Neurological:  Positive for weakness. Negative for tremors and numbness.  Hematological:  Negative for adenopathy. Does not bruise/bleed easily.  Psychiatric/Behavioral:  Negative for behavioral problems (Depression), sleep disturbance and suicidal ideas. The patient is not nervous/anxious.     Vital Signs: BP 126/88   Pulse (!) 101   Temp 98 F (36.7 C)   Resp 16   Ht 5' 9 (1.753 m)   Wt 292 lb 12.8 oz (132.8 kg)   SpO2 96%   BMI 43.24 kg/m    Physical Exam Vitals reviewed.  Constitutional:      General: She is not in acute distress.    Appearance: Normal appearance. She is obese. She is ill-appearing.  HENT:     Head: Normocephalic and atraumatic.     Mouth/Throat:     Mouth: Mucous membranes are moist.     Pharynx: Pharyngeal swelling, posterior oropharyngeal erythema and postnasal drip present.     Tonsils: Tonsillar exudate present. 2+ on the right. 2+ on the left.  Eyes:     Pupils: Pupils are equal, round, and reactive to light.  Cardiovascular:     Rate and Rhythm: Normal rate and regular rhythm.  Pulmonary:     Effort: Pulmonary effort is normal.  No respiratory distress.  Musculoskeletal:     Left lower leg: Swelling and tenderness (subcutaneous nodules) present. 2+ Edema present.  Skin:    General: Skin is warm and dry.     Capillary Refill: Capillary refill takes less than 2 seconds.  Neurological:     Mental Status: She is alert and oriented to person, place, and time.  Psychiatric:        Mood and Affect: Mood normal.        Behavior: Behavior normal.       Assessment/Plan: 1. Erythema nodosum (Primary) Routine and additional labs ordered for further evaluation - Antistreptolysin O titer - CBC with Differential/Platelet - CMP14+EGFR - Lipid Profile - B12 and Folate Panel - Vitamin D  (25 hydroxy) - TSH+T4F+T3Free - ANA Direct w/Reflex  if Positive - Rheumatoid Factor - Sed Rate (ESR) - C-reactive protein - HLA-B27 antigen - POCT rapid strep A - amoxicillin -clavulanate (AUGMENTIN ) 875-125 MG tablet; Take 1 tablet by mouth 2 (two) times daily for 10 days.  Dispense: 20 tablet; Refill: 0  2. Endometriosis Routine and additional labs ordered for further evaluation - Antistreptolysin O titer - CBC with Differential/Platelet - CMP14+EGFR - Lipid Profile - B12 and Folate Panel - Vitamin D  (25 hydroxy) - TSH+T4F+T3Free - ANA Direct w/Reflex if Positive - Rheumatoid Factor - Sed Rate (ESR) - C-reactive protein - HLA-B27 antigen - POCT rapid strep A  3. Acquired autoimmune hypothyroidism Routine and additional labs ordered for further evaluation - Antistreptolysin O titer - CBC with Differential/Platelet - CMP14+EGFR - Lipid Profile - B12 and Folate Panel - Vitamin D  (25 hydroxy) - TSH+T4F+T3Free - ANA Direct w/Reflex if Positive - Rheumatoid Factor - Sed Rate (ESR) - C-reactive protein - HLA-B27 antigen - POCT rapid strep A  4. Enlarged tonsils Routine and additional labs ordered for further evaluation Negative rapid strep test - Antistreptolysin O titer - CBC with Differential/Platelet -  CMP14+EGFR - Lipid Profile - B12 and Folate Panel - Vitamin D  (25 hydroxy) - TSH+T4F+T3Free - ANA Direct w/Reflex if Positive - Rheumatoid Factor - Sed Rate (ESR) - C-reactive protein - HLA-B27 antigen - POCT rapid strep A  5. Sore throat Routine and additional labs ordered for further evaluation Negative strep test but will treat for strep with augmentin  due to presenting symptoms.  - amoxicillin -clavulanate (AUGMENTIN ) 875-125 MG tablet; Take 1 tablet by mouth 2 (two) times daily for 10 days.  Dispense: 20 tablet; Refill: 0  6. Mixed hyperlipidemia Routine and additional labs ordered for further evaluation - Antistreptolysin O titer - CBC with Differential/Platelet - CMP14+EGFR - Lipid Profile - B12 and Folate Panel - Vitamin D  (25 hydroxy) - TSH+T4F+T3Free - ANA Direct w/Reflex if Positive - Rheumatoid Factor - Sed Rate (ESR) - C-reactive protein - HLA-B27 antigen - POCT rapid strep A  7. B12 deficiency Routine labs ordered  - Antistreptolysin O titer - CBC with Differential/Platelet - CMP14+EGFR - Lipid Profile - B12 and Folate Panel - Vitamin D  (25 hydroxy) - TSH+T4F+T3Free - ANA Direct w/Reflex if Positive - Rheumatoid Factor - Sed Rate (ESR) - C-reactive protein - HLA-B27 antigen - POCT rapid strep A  8. Vitamin D  deficiency Routine labs ordered  - Antistreptolysin O titer - CBC with Differential/Platelet - CMP14+EGFR - Lipid Profile - B12 and Folate Panel - Vitamin D  (25 hydroxy) - TSH+T4F+T3Free - ANA Direct w/Reflex if Positive - Rheumatoid Factor - Sed Rate (ESR) - C-reactive protein - HLA-B27 antigen - POCT rapid strep A  9. Obesity, morbid (HCC) Noted, will monitor, her underlying chronic medical problems complicate weight gain and hinder her ability to lose weight easily.      General Counseling: Demiah verbalizes understanding of the findings of todays visit and agrees with plan of treatment. I have discussed any further  diagnostic evaluation that may be needed or ordered today. We also reviewed her medications today. she has been encouraged to call the office with any questions or concerns that should arise related to todays visit.    Orders Placed This Encounter  Procedures   Antistreptolysin O titer   CBC with Differential/Platelet   CMP14+EGFR   Lipid Profile   B12 and Folate Panel   Vitamin D  (25 hydroxy)   TSH+T4F+T3Free   ANA Direct w/Reflex if Positive   Rheumatoid Factor  Sed Rate (ESR)   C-reactive protein   HLA-B27 antigen   POCT rapid strep A    Meds ordered this encounter  Medications   amoxicillin -clavulanate (AUGMENTIN ) 875-125 MG tablet    Sig: Take 1 tablet by mouth 2 (two) times daily for 10 days.    Dispense:  20 tablet    Refill:  0    Fill new script today    Return for CPE, Iyanni Hepp PCP in 3-4 weeks, please have labs done prior to visit. .  Time spent:30 Minutes Time spent with patient included reviewing progress notes, labs, imaging studies, and discussing plan for follow up.   [place] Controlled Substance Database was reviewed by me for overdose risk score (ORS)   This patient was seen by Mardy Maxin, FNP-C in collaboration with Dr. Sigrid Bathe as a part of collaborative care agreement.   Sonali Wivell R. Maxin, MSN, FNP-C Internal Medicine

## 2024-06-06 ENCOUNTER — Encounter: Payer: Self-pay | Admitting: Nurse Practitioner

## 2024-06-10 LAB — ANA W/REFLEX IF POSITIVE
Anti JO-1: <0.2 AI (ref 0.0–0.9)
Anti Nuclear Antibody (ANA): POSITIVE — AB
Centromere Ab Screen: >8 AI — ABNORMAL HIGH (ref 0.0–0.9)
Chromatin Ab SerPl-aCnc: 0.4 AI (ref 0.0–0.9)
ENA RNP Ab: <0.2 AI (ref 0.0–0.9)
ENA SM Ab Ser-aCnc: <0.2 AI (ref 0.0–0.9)
ENA SSA (RO) Ab: <0.2 AI (ref 0.0–0.9)
ENA SSB (LA) Ab: <0.2 AI (ref 0.0–0.9)
Scleroderma (Scl-70) (ENA) Antibody, IgG: <0.2 AI (ref 0.0–0.9)
dsDNA Ab: 1 [IU]/mL (ref 0–9)

## 2024-06-10 LAB — B12 AND FOLATE PANEL
Folate: 5.8 ng/mL
Vitamin B-12: 380 pg/mL (ref 232–1245)

## 2024-06-10 LAB — CMP14+EGFR
ALT: 18 IU/L (ref 0–32)
AST: 16 IU/L (ref 0–40)
Albumin: 4.6 g/dL (ref 4.0–5.0)
Alkaline Phosphatase: 95 IU/L (ref 41–116)
BUN/Creatinine Ratio: 18 (ref 9–23)
BUN: 13 mg/dL (ref 6–20)
Bilirubin Total: 0.4 mg/dL (ref 0.0–1.2)
CO2: 23 mmol/L (ref 20–29)
Calcium: 9.5 mg/dL (ref 8.7–10.2)
Chloride: 103 mmol/L (ref 96–106)
Creatinine, Ser: 0.74 mg/dL (ref 0.57–1.00)
Globulin, Total: 2.4 g/dL (ref 1.5–4.5)
Glucose: 98 mg/dL (ref 70–99)
Potassium: 4 mmol/L (ref 3.5–5.2)
Sodium: 141 mmol/L (ref 134–144)
Total Protein: 7 g/dL (ref 6.0–8.5)
eGFR: 116 mL/min/1.73

## 2024-06-10 LAB — CBC WITH DIFFERENTIAL/PLATELET
Basophils Absolute: 0.1 x10E3/uL (ref 0.0–0.2)
Basos: 1 %
EOS (ABSOLUTE): 0.2 x10E3/uL (ref 0.0–0.4)
Eos: 3 %
Hematocrit: 41.4 % (ref 34.0–46.6)
Hemoglobin: 13.5 g/dL (ref 11.1–15.9)
Immature Grans (Abs): 0 x10E3/uL (ref 0.0–0.1)
Immature Granulocytes: 0 %
Lymphocytes Absolute: 1.5 x10E3/uL (ref 0.7–3.1)
Lymphs: 20 %
MCH: 30.9 pg (ref 26.6–33.0)
MCHC: 32.6 g/dL (ref 31.5–35.7)
MCV: 95 fL (ref 79–97)
Monocytes Absolute: 0.7 x10E3/uL (ref 0.1–0.9)
Monocytes: 9 %
Neutrophils Absolute: 4.9 x10E3/uL (ref 1.4–7.0)
Neutrophils: 67 %
Platelets: 210 x10E3/uL (ref 150–450)
RBC: 4.37 x10E6/uL (ref 3.77–5.28)
RDW: 12.2 % (ref 11.7–15.4)
WBC: 7.3 x10E3/uL (ref 3.4–10.8)

## 2024-06-10 LAB — TSH+T4F+T3FREE
Free T4: 1.11 ng/dL (ref 0.82–1.77)
T3, Free: 3.4 pg/mL (ref 2.0–4.4)
TSH: 1.37 u[IU]/mL (ref 0.450–4.500)

## 2024-06-10 LAB — LIPID PANEL
Chol/HDL Ratio: 3.4 ratio (ref 0.0–4.4)
Cholesterol, Total: 156 mg/dL (ref 100–199)
HDL: 46 mg/dL
LDL Chol Calc (NIH): 91 mg/dL (ref 0–99)
Triglycerides: 103 mg/dL (ref 0–149)
VLDL Cholesterol Cal: 19 mg/dL (ref 5–40)

## 2024-06-10 LAB — ANTISTREPTOLYSIN O TITER: ASO: 31.1 [IU]/mL (ref 0.0–200.0)

## 2024-06-10 LAB — SEDIMENTATION RATE: Sed Rate: 9 mm/h (ref 0–32)

## 2024-06-10 LAB — C-REACTIVE PROTEIN: CRP: 3 mg/L (ref 0–10)

## 2024-06-10 LAB — RHEUMATOID FACTOR: Rheumatoid fact SerPl-aCnc: <10 [IU]/mL

## 2024-06-10 LAB — HLA-B27 ANTIGEN

## 2024-06-10 LAB — VITAMIN D 25 HYDROXY (VIT D DEFICIENCY, FRACTURES): Vit D, 25-Hydroxy: 22.1 ng/mL — ABNORMAL LOW (ref 30.0–100.0)

## 2024-06-16 ENCOUNTER — Ambulatory Visit: Payer: Self-pay | Admitting: Nurse Practitioner

## 2024-06-16 DIAGNOSIS — R7689 Other specified abnormal immunological findings in serum: Secondary | ICD-10-CM

## 2024-06-16 DIAGNOSIS — Z8269 Family history of other diseases of the musculoskeletal system and connective tissue: Secondary | ICD-10-CM

## 2024-06-16 NOTE — Progress Notes (Signed)
 She has a positive ANA and additional elevated antibodies on the ANA reflex. I am referring her to rheumatology for further evaluation. The rest of the results we will discuss in detail at her next office visit.

## 2024-06-17 ENCOUNTER — Telehealth: Payer: Self-pay | Admitting: Nurse Practitioner

## 2024-06-17 NOTE — Telephone Encounter (Signed)
-----   Message from Mardy Maxin, NP sent at 06/16/2024  8:22 AM EST ----- She has a positive ANA and additional elevated antibodies on the ANA reflex. I am referring her to rheumatology for further evaluation. The rest of the results we will discuss in detail at her next  office visit.

## 2024-06-17 NOTE — Telephone Encounter (Signed)
 Rheumatology referral sent via [facility] to [facility]. Notified patient. Gave patient telephone # [phone]

## 2024-06-17 NOTE — Telephone Encounter (Signed)
 Patient notified

## 2024-06-17 NOTE — Telephone Encounter (Signed)
 Left message for patient to give office a call back.

## 2024-06-24 NOTE — Progress Notes (Signed)
 "     Gynecology Annual Exam  PCP: Liana Fish, NP  Chief Complaint:  Chief Complaint  Patient presents with   Annual Exam    History of Present Illness: Patient is a 24 y.o. G0P0000 presents for annual exam. The patient has complaint today of occasional lower pelvic pain about once a month on one side or the other. She requests pelvic ultrasound to evaluate ovaries- had ultrasound and previous diagnosis of PCOS in 2022. Reports that she has frequent yeast infections- especially after taking antibiotics.  LMP: Patient's last menstrual period was 06/05/2024 (exact date). Since IUD placement in April of 2025 she was not having periods until they started monthly in November 2025. She mentioned a new diagnosis of erythema nodosum and has been taking antibiotics for that since about the same time. We discussed possible effects of stress as a cause for new menstrual bleeding. She requests check of IUD strings Postcoital Bleeding: no Dysmenorrhea: yes  The patient is sexually active. She currently uses IUD for contraception. She denies dyspareunia.  The patient does not perform self breast exams.  There is possible breast cancer on the maternal side. No known notable family history of breast or ovarian cancer in her family.  The patient wears seatbelts: yes.  The patient has regular exercise: no.    The patient denies current symptoms of depression.    Review of Systems: Review of Systems  Constitutional:  Negative for chills and fever.  HENT:  Negative for congestion, ear discharge, ear pain, hearing loss, sinus pain and sore throat.   Eyes:  Negative for blurred vision and double vision.  Respiratory:  Negative for cough, shortness of breath and wheezing.   Cardiovascular:  Negative for chest pain, palpitations and leg swelling.  Gastrointestinal:  Positive for abdominal pain. Negative for blood in stool, constipation, diarrhea, heartburn, melena, nausea and vomiting.  Genitourinary:   Negative for dysuria, flank pain, frequency, hematuria and urgency.  Musculoskeletal:  Negative for back pain, joint pain and myalgias.  Skin:  Negative for itching and rash.  Neurological:  Negative for dizziness, tingling, tremors, sensory change, speech change, focal weakness, seizures, loss of consciousness, weakness and headaches.  Endo/Heme/Allergies:  Negative for environmental allergies. Does not bruise/bleed easily.       Positive for vaginal bleeding  Psychiatric/Behavioral:  Negative for depression, hallucinations, memory loss, substance abuse and suicidal ideas. The patient is not nervous/anxious and does not have insomnia.     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 Contraception: IUD Last Pap: 03/15/21 Results were:  no abnormalities   Obstetric History: G0P0000  Family History:  Family History  Problem Relation Age  of Onset   Endometriosis Mother    Cushing syndrome Mother    Hyperlipidemia Father    Endometriosis Maternal Aunt    Breast cancer Neg Hx    Ovarian cancer Neg Hx    Colon cancer Neg Hx     Social History:  Social  History   Socioeconomic History   Marital status: Single    Spouse name: Not on file   Number of children: Not on file   Years of education: Not on file   Highest education level: Not on file  Occupational History   Not on file  Tobacco Use   Smoking status: Never   Smokeless tobacco: Never  Vaping Use   Vaping status: Never Used  Substance and Sexual Activity   Alcohol use: Yes    Comment: socially   Drug use: No   Sexual activity: Yes    Birth control/protection: Pill, Condom  Other Topics Concern   Not on file  Social History Narrative   Not on file   Social Drivers of Health   Tobacco Use: Low Risk (06/27/2024)   Patient History    Smoking Tobacco Use: Never    Smokeless Tobacco Use: Never    Passive Exposure: Not on file  Financial Resource Strain: Not on file  Food Insecurity: Not on file  Transportation Needs: Not on file  Physical Activity: Not on file  Stress: Not on file  Social Connections: Not on file  Intimate Partner Violence: Not on file  Depression (PHQ2-9): Low Risk (06/05/2024)   Depression (PHQ2-9)    PHQ-2 Score: 0  Alcohol Screen: Not on file  Housing: Not on file  Utilities: Not on file  Health Literacy: Not on file    Allergies:  Allergies[1]  Medications: Prior to Admission medications  Medication Sig Start Date End Date Taking? Authorizing Provider  levothyroxine  (SYNTHROID ) 137 MCG tablet Take 137 mcg by mouth daily before breakfast.    [provider]  liothyronine (CYTOMEL) 5 MCG tablet Take by mouth. 10/13/20 11/13/21  [provider]    Physical Exam Vitals: BP 119/86 (BP Location: Right Arm, Patient Position: Sitting, Cuff Size: Large)   Pulse 80   Wt 291 lb (132 kg)   LMP 06/05/2024 (Exact Date)   BMI 42.97 kg/m   General: NAD HEENT: normocephalic, anicteric Thyroid : no enlargement, no palpable nodules Pulmonary: No increased work of breathing, CTAB Cardiovascular: RRR, distal pulses 2+ Breast: Breast  symmetrical, no tenderness, no palpable nodules or masses, no skin or nipple retraction present, no nipple discharge.  No axillary or supraclavicular lymphadenopathy. Abdomen: NABS, soft, non-tender, non-distended.  Umbilicus without lesions.  No hepatomegaly, splenomegaly or masses palpable. No evidence of hernia  Genitourinary:  External: Normal external female genitalia.  Normal urethral meatus, normal Bartholin's and Skene's glands.    Vagina: Normal vaginal mucosa, no evidence of prolapse.    Cervix: Grossly normal in appearance, bleeding with collection of PAP specimen. IUD strings visualized- short and just visible at OS  Uterus: Non-enlarged, mobile, normal contour.  No CMT  Adnexa: ovaries non-enlarged, no adnexal masses  Rectal: deferred  Lymphatic: no evidence of inguinal lymphadenopathy Extremities: no edema, erythema, or tenderness Neurologic: Grossly intact Psychiatric: mood appropriate, affect full   Assessment: 24 y.o. G0P0000 routine annual exam  Plan: Problem List Items Addressed This Visit   None Visit Diagnoses       Well woman exam with routine gynecological exam    -  Primary   Relevant Orders  Cervicovaginal ancillary only   Cytology - PAP     Cervical cancer screening       Relevant Orders   Cytology - PAP     IUD check up         Depression screening         Vagina itching       Relevant Orders   Cervicovaginal ancillary only     Pelvic pain       Relevant Orders   US  PELVIS TRANSVAGINAL NON-OB (TV ONLY)       1) 4) Gardasil Series discussed and if applicable offered to patient - Patient has not previously completed 3 shot series   2) STI screening  was offered and declined  3)  ASCCP guidelines and rationale discussed.  Patient opts for every 3 years screening interval. PAP smear today  4) Contraception - the patient is currently using  IUD.  She is happy with her current form of contraception and plans to continue We discussed safe sex  practices to reduce her furture risk of STI's.    5) Return in about 1 week (around 07/04/2024) for gyn ultrasound.   Slater Rains, CNM 06/27/2024 10:22 AM          [1]  Allergies Allergen Reactions   Latex     Other reaction(s): Other (See Comments) Other Reaction: Sesitivity   Shrimp (Diagnostic)    Procyanidolic Oligomers Itching and Rash   "

## 2024-06-24 NOTE — Patient Instructions (Signed)
 Preventive Care 57-25 Years Old, Female Preventive care refers to lifestyle choices and visits with your health care provider that can promote health and wellness. Preventive care visits are also called wellness exams. What can I expect for my preventive care visit? Counseling During your preventive care visit, your health care provider may ask about your: Medical history, including: Past medical problems. Family medical history. Pregnancy history. Current health, including: Menstrual cycle. Method of birth control. Emotional well-being. Home life and relationship well-being. Sexual activity and sexual health. Lifestyle, including: Alcohol, nicotine  or tobacco, and drug use. Access to firearms. Diet, exercise, and sleep habits. Work and work Astronomer. Sunscreen use. Safety issues such as seatbelt and bike helmet use. Physical exam Your health care provider may check your: Height and weight. These may be used to calculate your BMI (body mass index). BMI is a measurement that tells if you are at a healthy weight. Waist circumference. This measures the distance around your waistline. This measurement also tells if you are at a healthy weight and may help predict your risk of certain diseases, such as type 2 diabetes and high blood pressure. Heart rate and blood pressure. Body temperature. Skin for abnormal spots. What immunizations do I need?  Vaccines are usually given at various ages, according to a schedule. Your health care provider will recommend vaccines for you based on your age, medical history, and lifestyle or other factors, such as travel or where you work. What tests do I need? Screening Your health care provider may recommend screening tests for certain conditions. This may include: Pelvic exam and Pap test. Lipid and cholesterol levels. Diabetes screening. This is done by checking your blood sugar (glucose) after you have not eaten for a while (fasting). Hepatitis  B test. Hepatitis C test. HIV (human immunodeficiency virus) test. STI (sexually transmitted infection) testing, if you are at risk. BRCA-related cancer screening. This may be done if you have a family history of breast, ovarian, tubal, or peritoneal cancers. Talk with your health care provider about your test results, treatment options, and if necessary, the need for more tests. Follow these instructions at home: Eating and drinking  Eat a healthy diet that includes fresh fruits and vegetables, whole grains, lean protein, and low-fat dairy products. Take vitamin and mineral supplements as recommended by your health care provider. Do not drink alcohol if: Your health care provider tells you not to drink. You are pregnant, may be pregnant, or are planning to become pregnant. If you drink alcohol: Limit how much you have to 0-1 drink a day. Know how much alcohol is in your drink. In the [place] one drink equals one 12 oz bottle of beer (355 mL), one 5 oz glass of wine (148 mL), or one 1 oz glass of hard liquor (44 mL). Lifestyle Brush your teeth every morning and night with fluoride toothpaste. Floss one time each day. Exercise for at least 30 minutes 5 or more days each week. Do not use any products that contain nicotine  or tobacco. These products include cigarettes, chewing tobacco, and vaping devices, such as e-cigarettes. If you need help quitting, ask your health care provider. Do not use drugs. If you are sexually active, practice safe sex. Use a condom or other form of protection to prevent STIs. If you do not wish to become pregnant, use a form of birth control. If you plan to become pregnant, see your health care provider for a prepregnancy visit. Find healthy ways to manage stress, such as: Meditation,  yoga, or listening to music. Journaling. Talking to a trusted person. Spending time with friends and family. Minimize exposure to UV radiation to reduce your risk of skin  cancer. Safety Always wear your seat belt while driving or riding in a vehicle. Do not drive: If you have been drinking alcohol. Do not ride with someone who has been drinking. If you have been using any mind-altering substances or drugs. While texting. When you are tired or distracted. Wear a helmet and other protective equipment during sports activities. If you have firearms in your house, make sure you follow all gun safety procedures. Seek help if you have been physically or sexually abused. What's next? Go to your health care provider once a year for an annual wellness visit. Ask your health care provider how often you should have your eyes and teeth checked. Stay up to date on all vaccines. This information is not intended to replace advice given to you by your health care provider. Make sure you discuss any questions you have with your health care provider. Document Revised: 11/10/2020 Document Reviewed: 11/10/2020 Elsevier Patient Education  2024 [employer] Pap Test: What to Know Why am I having this test? A Pap test, also called a Pap smear, is a screening test to check for signs of: Infection. Cancer of the cervix. The cervix is the lowest part of the uterus. Precancerous changes. These are changes that may be a sign that cancer is developing. Females need this test regularly. In general, you should have a Pap test every 3 years until you reach menopause or you are 25 years old. If you are 55-25 years old you may choose to have their Pap test done at the same time as an human papillomavirus (HPV) test every 5 years instead of every 3 years. Your health care provider may recommend having Pap tests more or less often depending on your medical conditions and past Pap test results. What is being tested? Cervical cells are tested for signs of infection or abnormalities. What kind of sample is taken?  Your provider will collect a sample of cells from the surface of your  cervix. This will be done using a small cotton swab, plastic spatula, or brush that is inserted into your vagina using a tool called a speculum. This sample is often collected during a pelvic exam, when you are lying on your back on an exam table with your feet in footrests, called stirrups. In some cases, fluids (secretions) from the cervix or vagina may also be collected. How do I prepare for this test? Know where you are in your menstrual cycle. If you're menstruating on the day of the test, you may be asked to reschedule. You may need to reschedule if you have a known vaginal infection on the day of the test. Follow instructions from your provider about: Changing or stopping your regular medicines. Some medicines, such as vaginal medicines and tetracycline, can cause abnormal test results. Avoiding douching 2-3 days before or the day of the test. Tell a health care provider about: Any allergies you have. All medicines you take. These include vitamins, herbs, eye drops, and creams. Any bleeding problems you have. Any surgeries you've had. Any medical problems you have. Whether you're pregnant or may be pregnant. How are the results reported? Your test results will be reported as either abnormal or normal. What do the results mean? A normal test result means that you do not have signs of cancer of the cervix. An abnormal  result may mean that you have: Cancer. A Pap test by itself is not enough to diagnose cancer. You will have more tests done if cancer is suspected. Precancerous changes in your cervix. Inflammation of the cervix. A sexually transmitted infection (STI). A fungal infection. An infection from a parasite. Talk with your provider about what your results mean. More tests may be needed. Questions to ask your health care provider Ask your provider, or the department that is doing the test: When will my results be ready? How will I get my results? What are my treatment  options? What other tests do I need? What are my next steps? This information is not intended to replace advice given to you by your health care provider. Make sure you discuss any questions you have with your health care provider. Document Revised: 08/04/2023 Document Reviewed: 08/04/2023 Elsevier Patient Education  2025 [employer]. How to Do a Breast Self-Exam Doing breast self-exams can help you stay healthy. They're one way to know what's normal for your breasts. They can help you catch a problem while it's still small and can be treated. You need to: Check your breasts often. Tell your doctor about any changes. You should do breast self-exams even if you have breast implants. What you need: A mirror. A well-lit room. A pillow or other soft object. How to do a breast self-exam Look for changes  Take off all the clothes above your waist. Stand in front of a mirror in a room with good lighting. Put your hands down at your sides. Compare your breasts in the mirror. Look for difference between them, such as: Differences in shape. Differences in size. Wrinkles, dips, and bumps in one breast and not the other. Look at each breast for skin changes, such as: Redness. Scaly spots. Spots where your skin is thicker. Dimpling. Open sores. Look for changes in your nipples, such as: Fluid coming out of a nipple. Fluid around a nipple. Bleeding. Dimpling. Redness. A nipple that looks pushed in or that has changed position. Feel for changes Lie on your back. Feel each breast. To do this: Pick a breast to feel. Place a pillow under the shoulder closest to that breast. Put the arm closest to that breast behind your head. Feel the breast using the hand of your other arm. Use the pads of your three middle fingers to make small circles starting near the nipple. Use light, medium, and firm pressure. Keep making circles, moving down over the breast. Stop when you feel your ribs. Start  making circles with your fingers again, this time going up until you reach your collarbone. Then, make circles out across your breast and into your armpit area. Squeeze your nipple. Check for fluid and lumps. Do these steps again to check your other breast. Sit or stand in the tub or shower. With soapy water on your skin, feel each breast the same way you did when you were lying down. Write down what you find Writing down what you find can help you keep track of what you want to tell your doctor. Write down: What's normal for each breast. Any changes you find. Write down: The kind of change. If your breast feels tender or painful. Any lump you find. Write down its size and where it is. When you last had your period. General tips If you're breastfeeding, the best time to check your breasts is after you feed your baby or after you use a breast pump. If you get  a period, the best time to check your breasts is 5-7 days after your period ends. With time, you'll get more used to doing the self-exam. You'll also start to know if there are changes in your breasts. Contact a doctor if: You see a change in the shape or size of your breasts or nipples. You see a change in the skin of your breast or nipples. You have fluid coming from your nipples that isn't normal. You find a new lump or thick area. You have breast pain. You have any concerns about your breast health. This information is not intended to replace advice given to you by your health care provider. Make sure you discuss any questions you have with your health care provider. Document Revised: 07/25/2023 Document Reviewed: 07/25/2023 Elsevier Patient Education  2025 [employer].

## 2024-06-27 ENCOUNTER — Encounter: Payer: Self-pay | Admitting: Advanced Practice Midwife

## 2024-06-27 ENCOUNTER — Other Ambulatory Visit (HOSPITAL_COMMUNITY)
Admission: RE | Admit: 2024-06-27 | Discharge: 2024-06-27 | Disposition: A | Source: Ambulatory Visit | Attending: Advanced Practice Midwife | Admitting: Advanced Practice Midwife

## 2024-06-27 ENCOUNTER — Ambulatory Visit: Admitting: Advanced Practice Midwife

## 2024-06-27 VITALS — BP 119/86 | HR 80 | Wt 291.0 lb

## 2024-06-27 DIAGNOSIS — Z30431 Encounter for routine checking of intrauterine contraceptive device: Secondary | ICD-10-CM

## 2024-06-27 DIAGNOSIS — Z01419 Encounter for gynecological examination (general) (routine) without abnormal findings: Secondary | ICD-10-CM | POA: Insufficient documentation

## 2024-06-27 DIAGNOSIS — N898 Other specified noninflammatory disorders of vagina: Secondary | ICD-10-CM

## 2024-06-27 DIAGNOSIS — Z124 Encounter for screening for malignant neoplasm of cervix: Secondary | ICD-10-CM

## 2024-06-27 DIAGNOSIS — Z1331 Encounter for screening for depression: Secondary | ICD-10-CM

## 2024-06-27 DIAGNOSIS — R102 Pelvic and perineal pain unspecified side: Secondary | ICD-10-CM

## 2024-06-27 LAB — CERVICOVAGINAL ANCILLARY ONLY
Bacterial Vaginitis (gardnerella): NEGATIVE
Candida Glabrata: NEGATIVE
Candida Vaginitis: NEGATIVE
Comment: NEGATIVE
Comment: NEGATIVE
Comment: NEGATIVE

## 2024-07-01 ENCOUNTER — Ambulatory Visit: Payer: Self-pay | Admitting: Advanced Practice Midwife

## 2024-07-01 LAB — CYTOLOGY - PAP
Comment: NEGATIVE
Comment: NEGATIVE
Comment: NEGATIVE
Diagnosis: NEGATIVE
Diagnosis: REACTIVE
HPV 16: NEGATIVE
HPV 18 / 45: NEGATIVE
High risk HPV: POSITIVE — AB

## 2024-07-02 ENCOUNTER — Encounter: Payer: Self-pay | Admitting: Certified Nurse Midwife

## 2024-07-02 ENCOUNTER — Other Ambulatory Visit: Payer: Self-pay | Admitting: Certified Nurse Midwife

## 2024-07-02 DIAGNOSIS — R87810 Cervical high risk human papillomavirus (HPV) DNA test positive: Secondary | ICD-10-CM | POA: Insufficient documentation

## 2024-07-02 DIAGNOSIS — Z113 Encounter for screening for infections with a predominantly sexual mode of transmission: Secondary | ICD-10-CM

## 2024-07-02 NOTE — Progress Notes (Signed)
 STI screening ordered at patient request, offered at annual and initially declined, however pap returned HPV positive and patient desires to complete screening.

## 2024-07-03 ENCOUNTER — Other Ambulatory Visit: Payer: Self-pay

## 2024-07-03 DIAGNOSIS — Z113 Encounter for screening for infections with a predominantly sexual mode of transmission: Secondary | ICD-10-CM

## 2024-07-17 ENCOUNTER — Other Ambulatory Visit

## 2024-07-23 ENCOUNTER — Encounter: Admitting: Nurse Practitioner
